# Patient Record
Sex: Female | Born: 1960 | Race: Black or African American | Hispanic: No | Marital: Married | Smoking: Never smoker
Health system: Southern US, Community
[De-identification: ages and names within clinical notes are randomized; demographics above are authoritative.]

## PROBLEM LIST (undated history)

## (undated) DIAGNOSIS — R200 Anesthesia of skin: Secondary | ICD-10-CM

## (undated) DIAGNOSIS — E079 Disorder of thyroid, unspecified: Secondary | ICD-10-CM

## (undated) DIAGNOSIS — T7840XA Allergy, unspecified, initial encounter: Secondary | ICD-10-CM

## (undated) DIAGNOSIS — I10 Essential (primary) hypertension: Secondary | ICD-10-CM

## (undated) DIAGNOSIS — M542 Cervicalgia: Secondary | ICD-10-CM

## (undated) DIAGNOSIS — R202 Paresthesia of skin: Secondary | ICD-10-CM

## (undated) HISTORY — DX: Allergy, unspecified, initial encounter: T78.40XA

## (undated) HISTORY — DX: Anesthesia of skin: R20.0

## (undated) HISTORY — DX: Essential (primary) hypertension: I10

## (undated) HISTORY — DX: Disorder of thyroid, unspecified: E07.9

## (undated) HISTORY — DX: Cervicalgia: M54.2

## (undated) HISTORY — DX: Paresthesia of skin: R20.2

---

## 2002-08-22 ENCOUNTER — Encounter: Admission: RE | Admit: 2002-08-22 | Discharge: 2002-08-22 | Payer: Self-pay | Admitting: Internal Medicine

## 2002-08-22 ENCOUNTER — Encounter: Payer: Self-pay | Admitting: Internal Medicine

## 2007-10-25 ENCOUNTER — Encounter: Admission: RE | Admit: 2007-10-25 | Discharge: 2007-10-25 | Payer: Self-pay | Admitting: Internal Medicine

## 2012-02-23 ENCOUNTER — Telehealth: Payer: Self-pay

## 2012-02-23 NOTE — Telephone Encounter (Signed)
.  umfc Victoria Petersen from [facility] Department called to request chest xray done in October of 2012.  Patient had +ppd in 2005 and is coming in for appt and stated that she had chest xray done at [facility] in 2012. Fax number is [phone].  Telephone for [name] is [phone].

## 2012-02-23 NOTE — Telephone Encounter (Signed)
Printed out phone message due to the patient's CXR is in the paper chart.

## 2012-10-24 ENCOUNTER — Ambulatory Visit (INDEPENDENT_AMBULATORY_CARE_PROVIDER_SITE_OTHER): Payer: 59 | Admitting: Emergency Medicine

## 2012-10-24 VITALS — BP 128/82 | HR 86 | Temp 98.6°F | Resp 17 | Ht 65.0 in | Wt 282.0 lb

## 2012-10-24 DIAGNOSIS — Z23 Encounter for immunization: Secondary | ICD-10-CM

## 2012-10-24 DIAGNOSIS — Z9229 Personal history of other drug therapy: Secondary | ICD-10-CM

## 2012-10-24 NOTE — Progress Notes (Signed)
  Subjective:    Patient ID: Victoria Petersen, female    DOB: [birth year], 52 y.o.   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 She is to get the summer and now needs a booster tetanus shot    Review of Systems     Objective:   Physical Exam        Assessment & Plan:  She will be given a DT booster today return in 6 months for her third shot

## 2013-07-03 ENCOUNTER — Ambulatory Visit (INDEPENDENT_AMBULATORY_CARE_PROVIDER_SITE_OTHER): Payer: 59 | Admitting: Family Medicine

## 2013-07-03 VITALS — BP 134/88 | HR 82 | Temp 98.5°F | Resp 18 | Ht 66.0 in | Wt 289.6 lb

## 2013-07-03 DIAGNOSIS — M549 Dorsalgia, unspecified: Secondary | ICD-10-CM

## 2013-07-03 DIAGNOSIS — R079 Chest pain, unspecified: Secondary | ICD-10-CM

## 2013-07-03 MED ORDER — GUAIFENESIN ER 1200 MG PO TB12: 1.0000 | ORAL_TABLET | Freq: Two times a day (BID) | ORAL | Status: DC | PRN

## 2013-07-03 MED ORDER — CYCLOBENZAPRINE HCL 10 MG PO TABS: 10.0000 mg | ORAL_TABLET | Freq: Three times a day (TID) | ORAL | Status: DC | PRN

## 2013-07-03 MED ORDER — MELOXICAM 15 MG PO TABS: 15.0000 mg | ORAL_TABLET | Freq: Every day | ORAL | Status: DC

## 2013-07-03 NOTE — Patient Instructions (Addendum)
Begin taking the Mobic (meloxicam) once daily for pain/inflammation.  Do not take any additional ibuprofen, Advil, Aleve with this medicine - you can take Tylenol if you need additional pain relief.  Taking the Flexeril (cyclobenzaprine) every 8 hours as need for pain/spasm - this may make you sleepy, so try at bedtime first.  Ok to take just at bedtime if it makes you too sleepy  Begin the Mucinex (guaifenesin) twice daily to help clear congestion.  You will be getting a call about scheduling with cardiology to discuss your EKG  Please let us know if any symptoms are worsening or not improving  If your chest pain becomes worse, or if you are feeling short of breath, come back in or go to the emergency department right away

## 2013-07-03 NOTE — Progress Notes (Signed)
Subjective:    Patient ID: Victoria Petersen, female    DOB: September 22, 1961, 52 y.o.   MRN: [id number]  HPI   [name] is a pleasant 52 yr old female here with concern for chest and upper back pain x 2 wks.  Thinks perhaps she has a cold with congestion in her chest, also thinks maybe pulled muscles.  States discomfort is across chest, primarily right side and between shoulder blades.  Denies coughing but may have had some wheezing 4 days ago.  No SOB.  No pain with deep inspiration.  No fever or other URI symptoms.  No abd pain.  Does do a lot of bending and stooping at work but cannot recall a specific injury to either area.  States she is not resting well because she can't get comfortable.  Movement of neck hurts.  CP does not worsen with exertion.  Nothing makes pain better or worse.    No prior cardiac hx though she is hypertensive.  Hyzaar is new within the last month.  Has been using ibuprofen which took the edge off.  States Advil did not work at all (?)  She had an episode similar to this 1 yr ago - states was treated for congestion and a pinched nerve.     Review of Systems  Constitutional: Negative for fever and chills.  HENT: Negative.   Respiratory: Positive for wheezing. Negative for cough and shortness of breath.   Cardiovascular: Positive for chest pain.  Gastrointestinal: Negative.   Musculoskeletal: Positive for back pain.  Skin: Negative.   Neurological: Negative for dizziness, syncope, weakness, light-headedness and headaches.       Objective:   Physical Exam  Vitals reviewed. Constitutional: She is oriented to person, place, and time. She appears well-developed and well-nourished. No distress.  HENT:  Head: Normocephalic and atraumatic.  Eyes: Conjunctivae are normal. No scleral icterus.  Cardiovascular: Normal rate, normal heart sounds and intact distal pulses.  Exam reveals no gallop and no friction rub.   No murmur heard. Pulmonary/Chest: Effort normal and breath  sounds normal. She has no wheezes. She has no rales. She exhibits tenderness.    Musculoskeletal:       Cervical back: She exhibits tenderness, pain and spasm. She exhibits normal range of motion.       Back:  Neurological: She is alert and oriented to person, place, and time.  Skin: Skin is warm and dry.  Psychiatric: She has a normal mood and affect.     EKG interp with Dr. Neva Seat - nonspecific T wave changes in III avF, no previous ekg to compare    Assessment & Plan:  Upper back pain - Plan: EKG 12-Lead, cyclobenzaprine (FLEXERIL) 10 MG tablet, meloxicam (MOBIC) 15 MG tablet  Chest pain - Plan: EKG 12-Lead, meloxicam (MOBIC) 15 MG tablet, Ambulatory referral to Cardiology   [name] is a 52 yr old female here with 2 wks of upper back and right sided chest pain.  NKI.  On exam, back pain is reproducible on palpation of paraspinals, esp left side.  Palpable spasm.  CP is not completely reproducible but it is right sided only with no radiation and no exacerbation with exertion.  Suspect MSK etiology of pain.  EKG today is borderline though with some non-specific T wave changes.  Given clinical picture, much less concerned for cardiac etiology, but will refer to cards for further eval, risk stratification given pt's age, HTN, and obesity.  Will treat with mobic and  flexeril.  Will try mucinex for congestion.  RTC if worsening or not improving.    Case discussed with Dr. Neva Seat

## 2013-07-05 NOTE — Progress Notes (Signed)
EKG read and patient discussed with Frances Furbish, PA-C. Agree with assessment and plan of care per her note.

## 2013-07-30 ENCOUNTER — Ambulatory Visit: Payer: Self-pay | Admitting: Cardiovascular Disease

## 2013-08-19 ENCOUNTER — Encounter: Payer: Self-pay | Admitting: Cardiovascular Disease

## 2013-08-19 DIAGNOSIS — T7840XA Allergy, unspecified, initial encounter: Secondary | ICD-10-CM | POA: Insufficient documentation

## 2013-08-19 DIAGNOSIS — I10 Essential (primary) hypertension: Secondary | ICD-10-CM | POA: Insufficient documentation

## 2013-08-19 DIAGNOSIS — E079 Disorder of thyroid, unspecified: Secondary | ICD-10-CM | POA: Insufficient documentation

## 2013-08-20 ENCOUNTER — Ambulatory Visit (INDEPENDENT_AMBULATORY_CARE_PROVIDER_SITE_OTHER): Payer: 59 | Admitting: Cardiovascular Disease

## 2013-08-20 ENCOUNTER — Encounter: Payer: Self-pay | Admitting: Cardiovascular Disease

## 2013-08-20 ENCOUNTER — Encounter (INDEPENDENT_AMBULATORY_CARE_PROVIDER_SITE_OTHER): Payer: Self-pay

## 2013-08-20 VITALS — BP 128/72 | HR 74 | Ht 65.0 in | Wt 290.8 lb

## 2013-08-20 DIAGNOSIS — R079 Chest pain, unspecified: Secondary | ICD-10-CM

## 2013-08-20 DIAGNOSIS — I10 Essential (primary) hypertension: Secondary | ICD-10-CM

## 2013-08-20 DIAGNOSIS — E669 Obesity, unspecified: Secondary | ICD-10-CM

## 2013-08-20 NOTE — Assessment & Plan Note (Signed)
Well controlled.  Continue current medications and low sodium Dash type diet.   F/U with primary to adjust meds

## 2013-08-20 NOTE — Assessment & Plan Note (Signed)
Discussed low carb diet  Refer to nutritionist

## 2013-08-20 NOTE — Assessment & Plan Note (Signed)
Atypical with ECG changes f/u stress myovue

## 2013-08-20 NOTE — Patient Instructions (Signed)
Your physician recommends that you schedule a follow-up appointment in: AS NEEDED  Your physician recommends that you continue on your current medications as directed. Please refer to the Current Medication list given to you today. Your physician has requested that you have en exercise stress myoview. For further information please visit [email or web address]. Please follow instruction sheet, as given.  

## 2013-08-20 NOTE — Progress Notes (Signed)
Patient ID: Victoria Petersen, female   DOB: 1961-05-04, 52 y.o.   MRN: [id number]      52 yo with HTN.  Father has a pacer. Seen at urgent care for atypical chest pain. Sharp pain in right side of chest and between shoulder blades.  No history of CAD.  Pain is a bit positional.  ECG with minor inferior T wave changes. She is sedentary with fairly high carb diet and sedentary. 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 Dr Elita Quick has been changing her BP meds and she is not clear that her current med is not contributing to problem  ROS: Denies fever, malais, weight loss, blurry vision, decreased visual acuity, cough, sputum, SOB, hemoptysis, pleuritic pain, palpitaitons, heartburn, abdominal pain, melena, lower extremity edema, claudication, or rash.  All other systems reviewed and negative   General: Affect appropriate Obese black female HEENT: normal Neck supple with no adenopathy JVP normal no bruits no thyromegaly Lungs clear with no wheezing and good diaphragmatic motion Heart:  S1/S2 no murmur,rub, gallop or click PMI normal Abdomen: benighn, BS positve, no tenderness, no AAA no bruit.  No HSM or HJR Distal pulses intact with no bruits No edema Neuro non-focal Skin warm and dry No muscular weakness  Medications Current Outpatient Prescriptions  Medication Sig Dispense Refill  . amLODipine (NORVASC) 5 MG tablet Take 5 mg by mouth daily.      Marland Kitchen ibuprofen (ADVIL,MOTRIN) 400 MG tablet Take 400 mg by mouth every 6 (six) hours as needed.      Marland Kitchen losartan-hydrochlorothiazide (HYZAAR) 50-12.5 MG per tablet Take 1 tablet by mouth daily.      Marland Kitchen MIMVEY 1-0.5 MG per tablet Take 1 tablet by mouth daily.       No current facility-administered medications for this visit.    Allergies Percocet  Family History: Family History  Problem Relation Age of Onset  .  Hypertension Mother   . Hypertension Father   . Hypertension Sister     Social History: History   Social History  . Marital Status: Married    Spouse Name: N/A    Number of Children: N/A  . Years of Education: N/A   Occupational History  . Not on file.   Social History Main Topics  . Smoking status: Never Smoker   . Smokeless tobacco: Not on file  . Alcohol Use: No  . Drug Use: No  . Sexual Activity: No   Other Topics Concern  . Not on file   Social History Narrative  . No narrative on file    Electrocardiogram:  9/18 SR inferior T wave changes   Assessment and Plan

## 2013-09-02 ENCOUNTER — Encounter: Payer: Self-pay | Admitting: Cardiology

## 2013-09-02 ENCOUNTER — Ambulatory Visit (HOSPITAL_COMMUNITY): Payer: 59 | Attending: Cardiology | Admitting: Radiology

## 2013-09-02 VITALS — BP 139/87 | HR 79 | Ht 65.0 in | Wt 289.0 lb

## 2013-09-02 DIAGNOSIS — I10 Essential (primary) hypertension: Secondary | ICD-10-CM | POA: Insufficient documentation

## 2013-09-02 DIAGNOSIS — R5381 Other malaise: Secondary | ICD-10-CM | POA: Insufficient documentation

## 2013-09-02 DIAGNOSIS — R9431 Abnormal electrocardiogram [ECG] [EKG]: Secondary | ICD-10-CM | POA: Insufficient documentation

## 2013-09-02 DIAGNOSIS — M549 Dorsalgia, unspecified: Secondary | ICD-10-CM | POA: Insufficient documentation

## 2013-09-02 DIAGNOSIS — R079 Chest pain, unspecified: Secondary | ICD-10-CM

## 2013-09-02 MED ORDER — TECHNETIUM TC 99M SESTAMIBI GENERIC - CARDIOLITE
30.0000 | Freq: Once | INTRAVENOUS | Status: AC | PRN
  Administered 2013-09-02: 30 via INTRAVENOUS

## 2013-09-02 NOTE — Progress Notes (Signed)
  [facility] SITE 3 NUCLEAR MED [address] [place], [place] [address] [phone]    Cardiology Nuclear Med Study  Victoria Petersen is a 52 y.o. female     MRN : [id number]     DOB: [birth year]  Procedure Date: 09/02/2013  Nuclear Med Background Indication for Stress Test:  Evaluation for Ischemia and Abnormal EKG:T wave changes History:  No known cardiac history Cardiac Risk Factors: Hypertension  Symptoms:  Fatigue with Exertion and Pain in her back at shoulder blades   Nuclear Pre-Procedure Caffeine/Decaff Intake:  None NPO After: 8:30pm   Lungs:  clear O2 Sat: 93% on room air. IV 0.9% NS with Angio Cath:  22g  IV Site: R Hand  IV Started by:  Cathlyn Parsons, RN  Chest Size (in):  42 Cup Size: C  Height: 5\' 5"  (1.651 m)  Weight:  289 lb (131.09 kg)  BMI:  Body mass index is 48.09 kg/(m^2). Tech Comments:  n/a    Nuclear Med Study 1 or 2 day study: 2 day  Stress Test Type:  Stress  Reading MD: Olga Millers, MD  Order Authorizing Provider:  Burna Cash  Resting Radionuclide: Technetium 22m Sestamibi  Resting Radionuclide Dose: 33.0 mCi on 09-02-13  Stress Radionuclide:  Technetium 54m Sestamibi  Stress Radionuclide Dose: 33.0 mCi on 09-04-13          Stress Protocol Rest HR: 79 Stress HR: 146  Rest BP: 139/87 Stress BP: 215/110  Exercise Time (min): 6:00 METS: 7.0   Predicted Max HR: 167 bpm % Max HR: 87.43 bpm Rate Pressure Product: 02725   Dose of Adenosine (mg):  n/a Dose of Lexiscan: n/a mg  Dose of Atropine (mg): n/a Dose of Dobutamine: n/a mcg/kg/min (at max HR)  Stress Test Technologist: Nelson Chimes, BS-ES  Nuclear Technologist:  Doyne Keel, CNMT     Rest Procedure:  Myocardial perfusion imaging was performed at rest 45 minutes following the intravenous administration of Technetium 19m Sestamibi. Rest ECG: NSR - Normal EKG  Stress Procedure:  The patient exercised on the treadmill utilizing the Bruce Protocol for 6:00 minutes.  The patient stopped due to SOB, fatigue and denied any chest pain.  Technetium 60m Sestamibi was injected at peak exercise and myocardial perfusion imaging was performed after a brief delay. Recovery uneventful.  Stress ECG: No significant change from baseline ECG  QPS Raw Data Images:  Normal; no motion artifact; normal heart/lung ratio. Stress Images:  Normal homogeneous uptake in all areas of the myocardium. Rest Images:  Normal homogeneous uptake in all areas of the myocardium. Subtraction (SDS):  Normal Transient Ischemic Dilatation (Normal <1.22):  1.12 Lung/Heart Ratio (Normal <0.45):  0.27  Quantitative Gated Spect Images QGS EDV:  97 ml QGS ESV:  32 ml  Impression Exercise Capacity:  Good exercise capacity. BP Response:  Hypertensive blood pressure response. Clinical Symptoms:  No significant symptoms noted. ECG Impression:  No significant ST segment change suggestive of ischemia. Comparison with Prior Nuclear Study: No previous nuclear study performed  Overall Impression:  Normal stress nuclear study. Mild breast attenuation  LV Ejection Fraction: 67%.  LV Wall Motion:  NL LV Function; NL Wall Motion  Charlton Haws

## 2013-09-04 ENCOUNTER — Encounter: Payer: Self-pay | Admitting: Cardiovascular Disease

## 2013-09-04 ENCOUNTER — Encounter (INDEPENDENT_AMBULATORY_CARE_PROVIDER_SITE_OTHER): Payer: Self-pay

## 2013-09-04 ENCOUNTER — Ambulatory Visit (HOSPITAL_COMMUNITY): Payer: 59 | Attending: Cardiovascular Disease | Admitting: Radiology

## 2013-09-04 DIAGNOSIS — R0989 Other specified symptoms and signs involving the circulatory and respiratory systems: Secondary | ICD-10-CM

## 2013-09-04 MED ORDER — TECHNETIUM TC 99M SESTAMIBI GENERIC - CARDIOLITE
33.0000 | Freq: Once | INTRAVENOUS | Status: AC | PRN
  Administered 2013-09-04: 33 via INTRAVENOUS

## 2013-09-19 ENCOUNTER — Ambulatory Visit (INDEPENDENT_AMBULATORY_CARE_PROVIDER_SITE_OTHER): Payer: 59 | Admitting: Family Medicine

## 2013-09-19 ENCOUNTER — Ambulatory Visit: Payer: 59

## 2013-09-19 VITALS — BP 122/70 | HR 76 | Temp 98.7°F | Resp 18 | Ht 64.5 in | Wt 288.0 lb

## 2013-09-19 DIAGNOSIS — Z111 Encounter for screening for respiratory tuberculosis: Secondary | ICD-10-CM

## 2013-09-19 DIAGNOSIS — R7611 Nonspecific reaction to tuberculin skin test without active tuberculosis: Secondary | ICD-10-CM

## 2013-09-19 NOTE — Progress Notes (Addendum)
Subjective:    Patient ID: Victoria Petersen, female    DOB: 1961-02-17, 52 y.o.   MRN: [id number] This chart was scribed for Victoria Staggers, MD by Danella Maiers, ED Scribe. This patient was seen in room 11 and the patient's care was started at 9:10 AM.  Authored by Silas Sacramento, MD - unable to change in system.   Chief Complaint  Patient presents with   x ray    has has positive in the past     HPI HPI Comments: TYEISHA Petersen is a 52 y.o. female who presents to the [facility] for a CXR. I saw her with Frances Furbish 9/18 with chest and upper back pain for the past 2 weeks, suspected MSK etiology at that time but nonspecific t-wave changes on an EKG. Referred to cardiology and treated with mobic and flexeril. Seen by cardiology 11/5. Diagnosed with atypical CP. Had stress test on 11/18 that was normal. Normal stress nuclear study. Sx's improved, but if recur, plans on seeing gastroenterologist. asx currently.   Pt states she is required by her work to have a CXR to look for TB. She states it has been 20 years since she has had a positive tb reading. She has never had any treatment for TB. She denies any symptoms currently. She states her last CXR was one year ago and negative. She denies fevers, chills.   Nursing Asst 2 - with [employer]    Tuberculosis Symptom Questionnaire  Do you currently have any of the following symptoms?  1. No Unexplained cough lasting more than 3 weeks?   2. No Unexplained fever lasting more than 3 weeks.   3. Yes  - having menopausal symptoms. Night Sweats (sweating that leaves the bedclothes and sheets wet)    4. No Shortness of Breath   5. No Chest Pain   6. No Unintentional weight loss    7. No.  Unexplained fatigue (very tired for no reason)    Patient Active Problem List   Diagnosis Date Noted   Chest pain 08/20/2013   Morbid obesity 08/20/2013   Allergy    Hypertension    Thyroid disease     Past Medical History  Diagnosis Date   Allergy    Hypertension    Thyroid disease    Past Surgical History  Procedure Laterality Date   Cesarean section     Allergies  Allergen Reactions   Percocet [Oxycodone-Acetaminophen] Itching   Prior to Admission medications   Medication Sig Start Date End Date Taking? Authorizing Provider  amLODipine (NORVASC) 5 MG tablet Take 5 mg by mouth daily.   Yes Historical Provider, MD  ibuprofen (ADVIL,MOTRIN) 400 MG tablet Take 400 mg by mouth every 6 (six) hours as needed.   Yes Historical Provider, MD  losartan-hydrochlorothiazide (HYZAAR) 50-12.5 MG per tablet Take 1 tablet by mouth daily.   Yes Historical Provider, MD  MIMVEY 1-0.5 MG per tablet Take 1 tablet by mouth daily. 07/08/13  Yes Historical Provider, MD   History  Substance Use Topics   Smoking status: Never Smoker    Smokeless tobacco: Not on file   Alcohol Use: No     Review of Systems As above.     Objective:   Physical Exam  Vitals reviewed. Constitutional: She is oriented to person, place, and time. She appears well-developed and well-nourished. No distress.  Cardiovascular: Normal rate, regular rhythm, normal heart sounds and intact distal pulses.  Pulmonary/Chest: Effort normal and breath sounds normal.  Neurological: She is alert and oriented to person, place, and time.  Skin: Skin is warm and dry. No rash noted.  Psychiatric: She has a normal mood and affect. Her behavior is normal.    Filed Vitals:   09/19/13 0841  BP: 122/70  Pulse: 76  Temp: 98.7 F (37.1 C)  TempSrc: Oral  Resp: 18  Height: 5' 4.5" (1.638 m)  Weight: 288 lb (130.636 kg)  SpO2: 96%    [facility] reading (PRIMARY) by  Dr. Neva Seat: 1 view CXR: NAD      Assessment & Plan:   ADALEI Petersen is a 52 y.o. 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 No acute symptoms as above, and XR appears ok. Note for work given, report pending.    Prior CP resolved. rtc precautions.   No orders of the defined types were placed in this encounter.   Patient Instructions  Your xray report can be sent to your employer if needed, but see the printed letter. Let me know if there are any questions.       I personally performed the services described in this documentation, which was scribed in my presence. The recorded information has been reviewed and considered, and addended by me as needed.

## 2013-09-19 NOTE — Progress Notes (Signed)
TB SCREENING in patient with history of POSITIVE reaction to TB Skin Test  Has the patient experienced any of the following in the last 12 months? Unexplained productive cough no Unexplained weight loss no Unexplained appetite loss no Unexplained fever no  Night sweats no Shortness of breath no   Chest pain no Increased fatigue no Exposure to Tuberculosis no Development of Diabetes no 

## 2013-09-19 NOTE — Patient Instructions (Signed)
Your xray report can be sent to your employer if needed, but see the printed letter. Let me know if there are any questions.

## 2013-11-14 ENCOUNTER — Ambulatory Visit (INDEPENDENT_AMBULATORY_CARE_PROVIDER_SITE_OTHER): Payer: 59 | Admitting: Neurology

## 2013-11-14 ENCOUNTER — Encounter: Payer: Self-pay | Admitting: Neurology

## 2013-11-14 ENCOUNTER — Encounter (INDEPENDENT_AMBULATORY_CARE_PROVIDER_SITE_OTHER): Payer: Self-pay

## 2013-11-14 VITALS — BP 138/93 | HR 86 | Ht 64.0 in | Wt 292.0 lb

## 2013-11-14 DIAGNOSIS — R209 Unspecified disturbances of skin sensation: Secondary | ICD-10-CM

## 2013-11-14 DIAGNOSIS — R202 Paresthesia of skin: Secondary | ICD-10-CM

## 2013-11-14 DIAGNOSIS — M79609 Pain in unspecified limb: Secondary | ICD-10-CM

## 2013-11-14 MED ORDER — DULOXETINE HCL 60 MG PO CPEP
60.0000 mg | ORAL_CAPSULE | Freq: Every day | ORAL | Status: DC
Start: 2013-11-14 — End: 2013-12-15

## 2013-11-14 NOTE — Progress Notes (Signed)
PATIENT: Victoria Petersen DOB: 07-09-1961  HISTORICAL  Victoria Petersen  is a 53 years old right-handed African American female, referred by her PCP Dr. Ludwig Clarks for evaluation of left-sided pain  She had past medical history of hypertension, presenting with his subacute onset of left-sided pain, involving left shoulder, left arm, and also left leg, she describes shooting pain from left lower back to her left hip, along her left leg, she denies significant weakness, left shoulder pain is almost constant, There was  no limited range of motion,  She described worsening left leg pain with standing, or prolonged sitting.  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 Angela Nevin, she had regular MRI of the brain, but I did not see any of the record    REVIEW OF SYSTEMS: Full 14 system review of systems performed and notable only for irritable bowel syndrome   ALLERGIES: Allergies  Allergen Reactions  . Percocet [Oxycodone-Acetaminophen] Itching    HOME MEDICATIONS: Outpatient Prescriptions Prior to Visit  Medication Sig Dispense Refill  . amLODipine (NORVASC) 5 MG tablet Take 5 mg by mouth daily.      Marland Kitchen losartan-hydrochlorothiazide (HYZAAR) 50-12.5 MG per tablet Take 1 tablet by mouth daily.      Marland Kitchen MIMVEY 1-0.5 MG per tablet Take 1 tablet by mouth daily.      Marland Kitchen ibuprofen (ADVIL,MOTRIN) 400 MG tablet Take 400 mg by mouth every 6 (six) hours as needed.         PAST MEDICAL HISTORY: Past Medical History  Diagnosis Date  . Allergy   . Hypertension   . Thyroid disease   . Neck pain   . Numbness   . Tingling     PAST SURGICAL HISTORY: Past Surgical History  Procedure Laterality Date  . Cesarean section      x2    FAMILY HISTORY: Family History  Problem Relation Age of Onset  . Hypertension Mother   . Hypertension Father   .  Hypertension Sister     SOCIAL HISTORY:  History   Social History  . Marital Status: Married    Spouse Name: Victoria Petersen    Number of Children: 2  . Years of Education: college   Occupational History  . [employer]   Social History Main Topics  . Smoking status: Never Smoker   . Smokeless tobacco: Never Used  . Alcohol Use: No  . Drug Use: No  . Sexual Activity: No   Other Topics Concern  . Not on file   Social History Narrative   Patient lives at home with her husband Victoria Petersen).   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   Right handed.   Education- B.S Human services   Caffeine- None     PHYSICAL EXAM   Filed Vitals:   11/14/13 1453  BP: 138/93  Pulse: 86  Height: 5\' 4"  (1.626 m)  Weight: 292 lb (132.45 kg)    Not recorded    Body mass index is 50.1 kg/(m^2).   Generalized: In no acute distress  Neck: Supple, no carotid bruits   Cardiac: Regular rate rhythm  Pulmonary: Clear to auscultation bilaterally  Musculoskeletal: No deformity mild tenderness of left shoulder upon deep palpation, there was no limited range of motion,  Neurological examination  Mentation: Alert oriented to time, place, history taking, and  causual conversation  Cranial nerve II-XII: Pupils were equal round reactive to light extraocular movements were full, Visual field were full on confrontational test. Bilateral fundi were sharp.  Facial sensation and strength were normal. Hearing was intact to finger rubbing bilaterally. Uvula tongue midline.  head turning and shoulder shrug and were normal and symmetric.Tongue protrusion into cheek strength was normal.  Motor: normal tone, bulk and strength.  Sensory: Intact to fine touch, pinprick, preserved vibratory sensation, and proprioception at toes.  Coordination: Normal finger to nose, heel-to-shin bilaterally there was no truncal ataxia  Gait: Rising up from seated position without assistance, normal  stance, without trunk ataxia, moderate stride, good arm swing, smooth turning, able to perform tiptoe, and heel walking without difficulty.   Romberg signs: Negative  Deep tendon reflexes: Brachioradialis 2/2, biceps 2/2, triceps 2/2, patellar 2/2, Achilles 2/2, plantar responses were flexor bilaterally.   DIAGNOSTIC DATA (LABS, IMAGING, TESTING) - I reviewed patient records, labs, notes, testing and imaging myself where available.   ASSESSMENT AND PLAN   53 years old Caucasian female, presenting with acute onset of left side pain, essentially normal neurological examination, reported a history of pituitary tumor,  1.  differentiation diagnosis including musculoskeletal etiology, need to rule out a right thalamic stroke 2 Proceed with MRI of the brain with and without contrast 3. EMG nerve conduction study  4 Cymbalta 60 mg every day           Levert FeinsteinYijun Antaeus Karel, M.D. Ph.D.  [facility] [address], [address] [place], [place] [address] [phone]

## 2013-11-17 ENCOUNTER — Telehealth: Payer: Self-pay | Admitting: Neurology

## 2013-11-17 NOTE — Telephone Encounter (Signed)
[facility] called stating that patient needs lab work done within 6 week before MRI is done. Victoria Petersen can be reached at [phone]. Please advise.

## 2013-11-17 NOTE — Telephone Encounter (Signed)
E[facility]Carolyn [facility] calling to state that Dr. Terrace ArabiaYan ordered an MRI for patient but patient has no recent labwork. Patient had hypertension and Eber JonesCarolyn needs some labwork done within 6 weeks before the MRI can be done. Please call her back and advise.

## 2013-11-18 ENCOUNTER — Telehealth: Payer: Self-pay | Admitting: *Deleted

## 2013-11-19 NOTE — Telephone Encounter (Addendum)
Victoria Petersen,   Please call patient to come in for CMP before MRI, if she had a CMP with in the past 6 weeks, she does not have to have repeat laboratory evaluation, but she does need to bring her laboratories at the day of the MRI  I will address her medication questions two-month followup visit

## 2013-11-20 ENCOUNTER — Ambulatory Visit (INDEPENDENT_AMBULATORY_CARE_PROVIDER_SITE_OTHER): Payer: 59 | Admitting: Neurology

## 2013-11-20 ENCOUNTER — Ambulatory Visit (INDEPENDENT_AMBULATORY_CARE_PROVIDER_SITE_OTHER): Payer: 59

## 2013-11-20 ENCOUNTER — Encounter (INDEPENDENT_AMBULATORY_CARE_PROVIDER_SITE_OTHER): Payer: Self-pay

## 2013-11-20 DIAGNOSIS — R202 Paresthesia of skin: Secondary | ICD-10-CM

## 2013-11-20 DIAGNOSIS — Z0289 Encounter for other administrative examinations: Secondary | ICD-10-CM

## 2013-11-20 DIAGNOSIS — R209 Unspecified disturbances of skin sensation: Secondary | ICD-10-CM

## 2013-11-20 DIAGNOSIS — M79609 Pain in unspecified limb: Secondary | ICD-10-CM

## 2013-11-20 MED ORDER — GADOPENTETATE DIMEGLUMINE 469.01 MG/ML IV SOLN: 20.0000 mL | Freq: Once | INTRAVENOUS | Status: AC | PRN

## 2013-11-20 NOTE — Procedures (Signed)
   NCS (NERVE CONDUCTION STUDY) WITH EMG (ELECTROMYOGRAPHY) REPORT   STUDY DATE: February fifth 2015 PATIENT NAME: Victoria ChuteSharon M President DOB: 1961/09/13 MRN: [id number]    TECHNOLOGIST: Gearldine ShownLorraine Jones ELECTROMYOGRAPHER: Levert FeinsteinYan, Bao Bazen M.D.  CLINICAL INFORMATION:   53 years old obese female, presenting with left arm, left leg radiating pain, left toes numbness  On examination: Bilateral upper and lower extremity motor strength was normal, deep tendon reflex was normal and symmetric    FINDINGS: NERVE CONDUCTION STUDY: Left peroneal sensory response was normal. Left peroneal to EDB, tibial motor responses were normal. Left tibial H reflex was normal and present.  Left median, ulnar sensory and motor responses were normal  NEEDLE ELECTROMYOGRAPHY: Selected needle examination was performed at left lower extremity muscle, at the left lumbosacral paraspinal muscles  Needle examination of left tibialis anterior, tibialis posterior, medial gastrocnemius, vastus lateralis, biceps femoris long head, gluteus medius was normal. There was increased insertion activity, 1 plus positive waves at left L4. there was no spontaneous activity at left L5, S1.  IMPRESSION:   This is a normal study. There was no electrodiagnostic study evidence of large fiber peripheral neuropathy, or left lumbar radiculopathy   INTERPRETING PHYSICIAN:   Levert FeinsteinYan, Peighton Mehra M.D. Ph.D. [facility] [address], [address] [place], [place] [address] [phone]

## 2013-11-21 MED ORDER — GABAPENTIN 300 MG PO CAPS: 300.0000 mg | ORAL_CAPSULE | Freq: Three times a day (TID) | ORAL | Status: DC

## 2013-11-21 NOTE — Telephone Encounter (Signed)
Patient had her MRI and had labs from her PCP that she brought with her.  She wants the doctor to prescribe something for pain.  She doesn't want to have to wait until her follow up visit.

## 2013-11-21 NOTE — Telephone Encounter (Signed)
Jessica: Please call patient I have called in Neurontin 300 mg 3 times a day for her,

## 2013-11-21 NOTE — Telephone Encounter (Signed)
I called the patient.  Got no answer.  Left message.   

## 2013-12-15 ENCOUNTER — Ambulatory Visit (INDEPENDENT_AMBULATORY_CARE_PROVIDER_SITE_OTHER): Payer: 59 | Admitting: Neurology

## 2013-12-15 ENCOUNTER — Encounter (INDEPENDENT_AMBULATORY_CARE_PROVIDER_SITE_OTHER): Payer: Self-pay

## 2013-12-15 ENCOUNTER — Encounter: Payer: Self-pay | Admitting: Neurology

## 2013-12-15 VITALS — BP 133/88 | HR 78 | Ht 64.0 in | Wt 293.0 lb

## 2013-12-15 DIAGNOSIS — M25512 Pain in left shoulder: Secondary | ICD-10-CM | POA: Insufficient documentation

## 2013-12-15 DIAGNOSIS — M25519 Pain in unspecified shoulder: Secondary | ICD-10-CM

## 2013-12-15 MED ORDER — CELECOXIB 100 MG PO CAPS
100.0000 mg | ORAL_CAPSULE | Freq: Two times a day (BID) | ORAL | Status: DC
Start: 2013-12-15 — End: 2022-01-03

## 2013-12-15 MED ORDER — CELECOXIB 100 MG PO CAPS: 100.0000 mg | ORAL_CAPSULE | Freq: Two times a day (BID) | ORAL | Status: DC

## 2013-12-15 NOTE — Progress Notes (Signed)
PATIENT: Victoria Petersen DOB: 03/14/1961  HISTORICAL  SERETHA ESTABROOKS  is a 53 years old right-handed African American female, referred by her PCP Dr. Ludwig Clarks for evaluation of left-sided pain  She had past medical history of hypertension, presenting with subacute onset of left-sided pain since December 2014, involving left shoulder, left arm, and also left leg, she describes shooting pain from left lower back to her left hip, along her left leg, she denies significant weakness, left shoulder pain is almost constant, There was  no limited range of motion,  She described worsening left leg pain with standing, or prolonged sitting.  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 Angela Nevin, she had regular MRI of the brain, but I did not see any of the record    Update March second 2015: MRI of the brain was normal, electrodiagnostic study was normal, there was no evidence of left cervical, lumbar radiculopathy, she continued complains of intermittent left shoulder pain, left lower back pain, radiating pain to her left leg, mild gait difficulty due to pain, bilateral toes numbness, getting worse by prolonged standing, sitting, heavy lifting, she is working on [employer] duty, which has helped   REVIEW OF SYSTEMS: Full 14 system review of systems performed and notable only for low back pain, left shoulder pain   ALLERGIES: Allergies  Allergen Reactions  . Percocet [Oxycodone-Acetaminophen] Itching    HOME MEDICATIONS: Outpatient Prescriptions Prior to Visit  Medication Sig Dispense Refill  . amLODipine (NORVASC) 5 MG tablet Take 5 mg by mouth daily.      Marland Kitchen losartan-hydrochlorothiazide (HYZAAR) 50-12.5 MG per tablet Take 1 tablet by mouth daily.      Marland Kitchen MIMVEY 1-0.5 MG per tablet Take 1 tablet by mouth daily.      Marland Kitchen ibuprofen (ADVIL,MOTRIN) 400  MG tablet Take 400 mg by mouth every 6 (six) hours as needed.         PAST MEDICAL HISTORY: Past Medical History  Diagnosis Date  . Allergy   . Hypertension   . Thyroid disease   . Neck pain   . Numbness   . Tingling     PAST SURGICAL HISTORY: Past Surgical History  Procedure Laterality Date  . Cesarean section      x2    FAMILY HISTORY: Family History  Problem Relation Age of Onset  . Hypertension Mother   . Hypertension Father   . Hypertension Sister     SOCIAL HISTORY:  History   Social History  . Marital Status: Married    Spouse Name: Romeo Apple    Number of Children: 2  . Years of Education: college   Occupational History  . [employer]   Social History Main Topics  . Smoking status: Never Smoker   . Smokeless tobacco: Never Used  . Alcohol Use: No  . Drug Use: No  . Sexual Activity: No   Other Topics Concern  . Not on file   Social History Narrative   Patient lives at home with her husband Sharlet Salina).   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   Right handed.   Education- B.S Human services   Caffeine- None     PHYSICAL EXAM   Filed Vitals:   12/15/13 0842  BP: 133/88  Pulse: 78  Height: 5\' 4"  (1.626 m)  Weight: 293  lb (132.904 kg)    Not recorded    Body mass index is 50.27 kg/(m^2).   Generalized: In no acute distress  Neck: Supple, no carotid bruits   Cardiac: Regular rate rhythm  Pulmonary: Clear to auscultation bilaterally  Musculoskeletal: No deformity mild tenderness of left shoulder upon deep palpation, there was no limited range of motion,  Neurological examination  Mentation: Alert oriented to time, place, history taking, and causual conversation  Cranial nerve II-XII: Pupils were equal round reactive to light extraocular movements were full, Visual field were full on confrontational test. Bilateral fundi were sharp.  Facial sensation and strength were normal. Hearing was intact to finger  rubbing bilaterally. Uvula tongue midline.  head turning and shoulder shrug and were normal and symmetric.Tongue protrusion into cheek strength was normal.  Motor: normal tone, bulk and strength.  Tenderness upon deep palpation at left shoulder, left SI joints,   Sensory: Intact to fine touch, pinprick, preserved vibratory sensation, and proprioception at toes.  Coordination: Normal finger to nose, heel-to-shin bilaterally there was no truncal ataxia  Gait: Rising up from seated position without assistance, Mild atalgic gait, was able to perform tiptoe, heel walking,   Romberg signs: Negative  Deep tendon reflexes: Brachioradialis 2/2, biceps 2/2, triceps 2/2, patellar 2/2, Achilles 2/2, plantar responses were flexor bilaterally.   DIAGNOSTIC DATA (LABS, IMAGING, TESTING) - I reviewed patient records, labs, notes, testing and imaging myself where available.   ASSESSMENT AND PLAN   53 years old Caucasian female, presenting with acute onset of left side pain, essentially normal neurological examination, reported a history of pituitary tumor,Normal MRI of brain, electrodiagnostic study, there was no evidence of left cervical, left lumbosacral radiculopathies.   her pain is most consistent with musculoskeletal etiology, I will add on Celebrex 100 mg twice a day, refer her to orthopedic clinic, return to clinic as needed     Levert FeinsteinYijun Venson Ferencz, M.D. Ph.D.  [facility] [address], [address] [place], [place] [address] [phone]

## 2013-12-16 ENCOUNTER — Ambulatory Visit: Payer: Self-pay | Admitting: Neurology

## 2014-01-28 ENCOUNTER — Telehealth: Payer: Self-pay | Admitting: Neurology

## 2014-01-28 NOTE — Telephone Encounter (Signed)
I have reviewed her chart, extensive evaluation fail to demonstrate neurological conditions, with her complains of continued pain, frustration, [facility]

## 2017-07-30 NOTE — Telephone Encounter (Signed)
Close Encounter 

## 2021-12-30 ENCOUNTER — Other Ambulatory Visit: Payer: Self-pay

## 2021-12-30 ENCOUNTER — Encounter (HOSPITAL_BASED_OUTPATIENT_CLINIC_OR_DEPARTMENT_OTHER): Payer: Self-pay | Admitting: *Deleted

## 2021-12-30 ENCOUNTER — Emergency Department (HOSPITAL_BASED_OUTPATIENT_CLINIC_OR_DEPARTMENT_OTHER): Payer: Managed Care, Other (non HMO)

## 2021-12-30 ENCOUNTER — Inpatient Hospital Stay (HOSPITAL_BASED_OUTPATIENT_CLINIC_OR_DEPARTMENT_OTHER)
Admission: EM | Admit: 2021-12-30 | Discharge: 2022-01-03 | DRG: 176 | Disposition: A | Discharge status: Home / Self Care | Payer: Managed Care, Other (non HMO) | Attending: Cardiovascular Disease | Admitting: Cardiovascular Disease

## 2021-12-30 DIAGNOSIS — Z8249 Family history of ischemic heart disease and other diseases of the circulatory system: Secondary | ICD-10-CM

## 2021-12-30 DIAGNOSIS — Z79899 Other long term (current) drug therapy: Secondary | ICD-10-CM

## 2021-12-30 DIAGNOSIS — Z888 Allergy status to other drugs, medicaments and biological substances status: Secondary | ICD-10-CM

## 2021-12-30 DIAGNOSIS — I2694 Multiple subsegmental pulmonary emboli without acute cor pulmonale: Principal | ICD-10-CM | POA: Diagnosis present

## 2021-12-30 DIAGNOSIS — I214 Non-ST elevation (NSTEMI) myocardial infarction: Principal | ICD-10-CM | POA: Diagnosis present

## 2021-12-30 DIAGNOSIS — E039 Hypothyroidism, unspecified: Secondary | ICD-10-CM | POA: Diagnosis present

## 2021-12-30 DIAGNOSIS — I10 Essential (primary) hypertension: Secondary | ICD-10-CM | POA: Diagnosis present

## 2021-12-30 DIAGNOSIS — I82452 Acute embolism and thrombosis of left peroneal vein: Secondary | ICD-10-CM | POA: Diagnosis present

## 2021-12-30 DIAGNOSIS — Z20822 Contact with and (suspected) exposure to covid-19: Secondary | ICD-10-CM | POA: Diagnosis present

## 2021-12-30 DIAGNOSIS — E785 Hyperlipidemia, unspecified: Secondary | ICD-10-CM | POA: Diagnosis present

## 2021-12-30 DIAGNOSIS — E1165 Type 2 diabetes mellitus with hyperglycemia: Secondary | ICD-10-CM | POA: Diagnosis present

## 2021-12-30 DIAGNOSIS — I82442 Acute embolism and thrombosis of left tibial vein: Secondary | ICD-10-CM | POA: Diagnosis present

## 2021-12-30 DIAGNOSIS — Z6841 Body Mass Index (BMI) 40.0 and over, adult: Secondary | ICD-10-CM

## 2021-12-30 DIAGNOSIS — I2699 Other pulmonary embolism without acute cor pulmonale: Secondary | ICD-10-CM | POA: Diagnosis present

## 2021-12-30 LAB — BASIC METABOLIC PANEL
Anion gap: 10 (ref 5–15)
BUN: 14 mg/dL (ref 8–23)
CO2: 26 mmol/L (ref 22–32)
Calcium: 9.2 mg/dL (ref 8.9–10.3)
Chloride: 99 mmol/L (ref 98–111)
Creatinine, Ser: 1.02 mg/dL — ABNORMAL HIGH (ref 0.44–1.00)
GFR, Estimated: >60 mL/min (ref >60)
Glucose, Bld: 253 mg/dL — ABNORMAL HIGH (ref 70–99)
Potassium: 3 mmol/L — ABNORMAL LOW (ref 3.5–5.1)
Sodium: 135 mmol/L (ref 135–145)

## 2021-12-30 LAB — D-DIMER, QUANTITATIVE: D-Dimer, Quant: 5.61 ug/mL-FEU — ABNORMAL HIGH (ref 0.00–0.50)

## 2021-12-30 LAB — CBC
HCT: 43.1 % (ref 36.0–46.0)
Hemoglobin: 14.4 g/dL (ref 12.0–15.0)
MCH: 27.6 pg (ref 26.0–34.0)
MCHC: 33.4 g/dL (ref 30.0–36.0)
MCV: 82.6 fL (ref 80.0–100.0)
Platelets: 310 10*3/uL (ref 150–400)
RBC: 5.22 MIL/uL — ABNORMAL HIGH (ref 3.87–5.11)
RDW: 15.1 % (ref 11.5–15.5)
WBC: 17.6 10*3/uL — ABNORMAL HIGH (ref 4.0–10.5)
nRBC: 0 % (ref 0.0–0.2)

## 2021-12-30 LAB — RESP PANEL BY RT-PCR (FLU A&B, COVID) ARPGX2
Influenza A by PCR: NEGATIVE
Influenza B by PCR: NEGATIVE
SARS Coronavirus 2 by RT PCR: NEGATIVE

## 2021-12-30 LAB — TROPONIN I (HIGH SENSITIVITY)
Troponin I (High Sensitivity): 832 ng/L (ref <18)
Troponin I (High Sensitivity): 789 ng/L (ref <18)

## 2021-12-30 MED ORDER — HEPARIN BOLUS VIA INFUSION
2000.0000 [IU] | Freq: Once | INTRAVENOUS | Status: AC
Start: 2021-12-30 — End: 2021-12-30
  Administered 2021-12-30: 2000 [IU] via INTRAVENOUS

## 2021-12-30 MED ORDER — ASPIRIN 81 MG PO CHEW
324.0000 mg | CHEWABLE_TABLET | Freq: Once | ORAL | Status: AC
  Administered 2021-12-30: 324 mg via ORAL
  Filled 2021-12-30: qty 4

## 2021-12-30 MED ORDER — HEPARIN (PORCINE) 25000 UT/250ML-% IV SOLN
1650.0000 [IU]/h | INTRAVENOUS | Status: DC
  Administered 2021-12-30: 1000 [IU]/h via INTRAVENOUS
  Administered 2021-12-31 – 2022-01-01 (×3): 1550 [IU]/h via INTRAVENOUS
  Administered 2022-01-02: 1650 [IU]/h via INTRAVENOUS
  Filled 2021-12-30 (×5): qty 250

## 2021-12-30 MED ORDER — FENTANYL CITRATE PF 50 MCG/ML IJ SOSY
25.0000 ug | PREFILLED_SYRINGE | Freq: Once | INTRAMUSCULAR | Status: AC
  Administered 2021-12-31: 25 ug via INTRAVENOUS
  Filled 2021-12-30: qty 1

## 2021-12-30 MED ORDER — IOHEXOL 350 MG/ML SOLN
75.0000 mL | Freq: Once | INTRAVENOUS | Status: AC | PRN
  Administered 2021-12-30: 75 mL via INTRAVENOUS

## 2021-12-30 MED ORDER — HEPARIN BOLUS VIA INFUSION
4000.0000 [IU] | Freq: Once | INTRAVENOUS | Status: AC
  Administered 2021-12-30: 4000 [IU] via INTRAVENOUS

## 2021-12-30 NOTE — ED Notes (Signed)
ED Provider at bedside. 

## 2021-12-30 NOTE — ED Notes (Signed)
Critical trop reported at this time. 832 trop.Dr. Pearline Cables made aware. ?Report given to [facility]. ?

## 2021-12-30 NOTE — ED Triage Notes (Signed)
Sob and chest pain since 8am.  ?

## 2021-12-30 NOTE — Progress Notes (Signed)
ANTICOAGULATION CONSULT NOTE  ? ?Pharmacy Consult for heparin ?Indication: chest pain/ACS > PE ? ?Allergies  ?Allergen Reactions  ? Percocet [Oxycodone-Acetaminophen] Itching  ? ? ?Patient Measurements: ?Height: 5\' 4"  (162.6 cm) ?Weight: 128.8 kg (284 lb) ?IBW/kg (Calculated) : 54.7 ?Heparin Dosing Weight: 86.5 kg ? ?Vital Signs: ?Temp: 98.3 ?F (36.8 ?C) (03/17 1753) ?BP: 108/79 (03/17 2000) ?Pulse Rate: 95 (03/17 2000) ? ?Labs: ?Recent Labs  ?  12/30/21 ?1812 12/30/21 ?2009  ?HGB 14.4  --   ?HCT 43.1  --   ?PLT 310  --   ?CREATININE 1.02*  --   ?TROPONINIHS 832* 789*  ? ? ? ?Estimated Creatinine Clearance: 77.1 mL/min (A) (by C-G formula based on SCr of 1.02 mg/dL (H)). ? ? ?Medical History: ?Past Medical History:  ?Diagnosis Date  ? Allergy   ? Hypertension   ? Neck pain   ? Numbness   ? Thyroid disease   ? Tingling   ? ? ?Medications:  ?Patient on no anticoagulation PTA.  ? ?Assessment: ?Patient presented with chest pain. Troponin is 832. Hemoglobin 14.4, HCT 43.1.  ? ?Heparin for ACS initially started, now CT angio chest showing PE, will re-bolus and increase gtt rate ? ?Goal of Therapy:  ?Heparin level 0.3-0.7 units/ml ?Monitor platelets by anticoagulation protocol: Yes ?  ?Plan:  ?Heparin 2000 units IV x 1, and gtt at 1400 units/hr ?F/u 6 hour heparin level ? ?2010, PharmD ?Clinical Pharmacist ?ED Pharmacist Phone # [phone] ?12/30/2021 9:26 PM ? ? ? ?

## 2021-12-30 NOTE — ED Notes (Signed)
PT assisted on bedside commode. Sats 85% on RA when she got back in bed. She did go back to 96%.  ?

## 2021-12-30 NOTE — ED Provider Notes (Addendum)
?[facility] EMERGENCY DEPARTMENT ?Provider Note ? ? ?CSN: [id number] ?Arrival date & time: 12/30/21  1746 ? ?  ? ?History ? ?Chief Complaint  ?Patient presents with  ? Chest Pain  ? ? ?Victoria Petersen is a 61 y.o. female. ? ?Pt is a 61 yo female with pmh of htn and DM presenting for sob. Pt states she was at work when she developed sob and chest pain. Chest pain is sternally located, without radiation, no associated diaphoresis or nausea. No prior hx of MI. No smoking hx. No hyperlipidemia. No prior hx of DVT/PE.  ? ?The history is provided by the patient. No language interpreter was used.  ? ?  ? ?Home Medications ?Prior to Admission medications   ?Medication Sig Start Date End Date Taking? Authorizing Provider  ?amLODipine (NORVASC) 5 MG tablet Take 5 mg by mouth daily.    [provider]  ?celecoxib (CELEBREX) 100 MG capsule Take 1 capsule (100 mg total) by mouth 2 (two) times daily. 12/15/13   Levert Feinstein, MD  ?hyoscyamine (LEVSIN SL) 0.125 MG SL tablet Take 125 mg by mouth. 10/20/13   [provider]  ?losartan-hydrochlorothiazide (HYZAAR) 50-12.5 MG per tablet Take 1 tablet by mouth daily.    [provider]  ?MIMVEY 1-0.5 MG per tablet Take 1 tablet by mouth daily. 07/08/13   [provider]  ?Naproxen Sodium (ALEVE PO) Take by mouth daily.    [provider]  ?   ? ?Allergies    ?Percocet [oxycodone-acetaminophen]   ? ?Review of Systems   ?Review of Systems  ?Constitutional:  Negative for chills and fever.  ?HENT:  Negative for ear pain and sore throat.   ?Eyes:  Negative for pain and visual disturbance.  ?Respiratory:  Positive for chest tightness and shortness of breath. Negative for cough.   ?Cardiovascular:  Positive for chest pain. Negative for palpitations.  ?Gastrointestinal:  Negative for abdominal pain and vomiting.  ?Genitourinary:  Negative for dysuria and hematuria.  ?Musculoskeletal:  Negative for arthralgias and back pain.  ?Skin:  Negative for  color change and rash.  ?Neurological:  Negative for seizures and syncope.  ?All other systems reviewed and are negative. ? ?Physical Exam ?Updated Vital Signs ?BP 118/82   Pulse 93   Temp 98.3 ?F (36.8 ?C)   Resp (!) 23   Ht 5\' 4"  (1.626 m)   Wt 128.8 kg   SpO2 96%   BMI 48.75 kg/m?  ?Physical Exam ?Vitals and nursing note reviewed.  ?Constitutional:   ?   General: She is not in acute distress. ?   Appearance: She is well-developed.  ?HENT:  ?   Head: Normocephalic and atraumatic.  ?Eyes:  ?   Conjunctiva/sclera: Conjunctivae normal.  ?Cardiovascular:  ?   Rate and Rhythm: Normal rate and regular rhythm.  ?   Heart sounds: No murmur heard. ?Pulmonary:  ?   Effort: Pulmonary effort is normal. No respiratory distress.  ?   Breath sounds: Normal breath sounds.  ?Abdominal:  ?   Palpations: Abdomen is soft.  ?   Tenderness: There is no abdominal tenderness.  ?Musculoskeletal:     ?   General: No swelling.  ?   Cervical back: Neck supple.  ?Skin: ?   General: Skin is warm and dry.  ?   Capillary Refill: Capillary refill takes less than 2 seconds.  ?Neurological:  ?   Mental Status: She is alert.  ?Psychiatric:     ?   Mood  and Affect: Mood normal.  ? ? ?ED Results / Procedures / Treatments   ?Labs ?(all labs ordered are listed, but only abnormal results are displayed) ?Labs Reviewed  ?BASIC METABOLIC PANEL - Abnormal; Notable for the following components:  ?    Result Value  ? Potassium 3.0 (*)   ? Glucose, Bld 253 (*)   ? Creatinine, Ser 1.02 (*)   ? All other components within normal limits  ?CBC - Abnormal; Notable for the following components:  ? WBC 17.6 (*)   ? RBC 5.22 (*)   ? All other components within normal limits  ?D-DIMER, QUANTITATIVE - Abnormal; Notable for the following components:  ? D-Dimer, Quant 5.61 (*)   ? All other components within normal limits  ?TROPONIN I (HIGH SENSITIVITY) - Abnormal; Notable for the following components:  ? Troponin I (High Sensitivity) 832 (*)   ? All other  components within normal limits  ?TROPONIN I (HIGH SENSITIVITY) - Abnormal; Notable for the following components:  ? Troponin I (High Sensitivity) 789 (*)   ? All other components within normal limits  ?RESP PANEL BY RT-PCR (FLU A&B, COVID) [id number]  ?HEPARIN LEVEL (UNFRACTIONATED)  ? ? ?EKG ?EKG Interpretation ? 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 ?Radiology ?DG Chest 2 View ? ?Result Date: 12/30/2021 ?CLINICAL DATA:  Chest pain. EXAM: CHEST - 2 VIEW COMPARISON:  04/30/2020 FINDINGS: Normal heart size. Stable mediastinal contours. The lungs are clear. Pulmonary vasculature is normal. No consolidation, pleural effusion, or pneumothorax. No acute osseous abnormalities are seen. IMPRESSION: No acute chest findings. Electronically Signed   By: Narda Rutherford M.D.   On: 12/30/2021 18:27  ? ?CT Angio Chest PE W and/or Wo Contrast ? ?Result Date: 12/30/2021 ?CLINICAL DATA:  Shortness of breath and chest pain. High probability pulmonary embolism suspected. EXAM: CT ANGIOGRAPHY CHEST WITH CONTRAST TECHNIQUE: Multidetector CT imaging of the chest was performed using the standard protocol during bolus administration of intravenous contrast. Multiplanar CT image reconstructions and MIPs were obtained to evaluate the vascular anatomy. RADIATION DOSE REDUCTION: This exam was performed according to the departmental dose-optimization program which includes automated exposure control, adjustment of the mA and/or kV according to patient size and/or use of iterative reconstruction technique. CONTRAST:  45mL OMNIPAQUE IOHEXOL 350 MG/ML SOLN COMPARISON:  Chest two views 12/30/2021 FINDINGS: Cardiovascular: Main pulmonary artery is opacified up to 285 Hounsfield units. There  are multiple filling defects indicating right-greater-than-left pulmonary emboli. This includes a nearly occlusive thrombus within the right upper lobe pulmonary artery and more distal pulmonary arteries, moderate filling defects within the medial and lateral segments of the right middle lobe and more distal pulmonary arteries, high-grade partial filling defects within the right lower lobe pulmonary artery and additional more distal filling defects and filling defects within the apicoposterior segment of the left upper lobe pulmonary artery and likely within the distal subsegmental left lower lobe pulmonary arteries. There is no interventricular septal bowing to indicate CT evidence of right-sided heart strain. No pericardial effusion. Heart size is mildly enlarged. No thoracic aortic aneurysm. Mediastinum/Nodes: No axillary, mediastinal or hilar pathologically enlarged lymph nodes by CT criteria. The visualized thyroid lobe is unremarkable. The esophagus follows a normal course of normal caliber. Lungs/Pleura: The central  airways are patent. Mild bilateral posterior dependent subsegmental atelectasis. No pleural effusion or pneumothorax. Upper Abdomen: No acute abnormality. Musculoskeletal: Mild-to-moderate multilevel degenerative disc changes of the thoracic spine. Review of the MIP images confirms the above findings. IMPRESSION:: IMPRESSION: 1. There are filling defects indicating pulmonary emboli within the right upper lobe lobar, medial and lateral segments of the right middle lobe, right lower lobe lobar, and apicoposterior segment left upper lobe pulmonary arteries. No CT evidence of right-sided heart strain. 2. No acute lung process. Critical Value/emergent results were called by telephone at the time of interpretation on 12/30/2021 at 9:13 pm to provider Edwin DadaALICIA Edgar Reisz , who verbally acknowledged these results. Electronically Signed   By: Neita Garnetonald  Viola M.D.   On: 12/30/2021 21:14   ? ?Procedures ?Marland Kitchen.Critical  Care ?Performed by: Franne FortsGray, Rhilee Currin P, DO ?Authorized by: Franne FortsGray, Berkeley Vanaken P, DO  ? ?Critical care provider statement:  ?  Critical care time (minutes):  104 ?  Critical care was necessary to treat or prevent imminent or life-th

## 2021-12-30 NOTE — Progress Notes (Signed)
ANTICOAGULATION CONSULT NOTE - Initial Consult ? ?Pharmacy Consult for heparin ?Indication: chest pain/ACS ? ?Allergies  ?Allergen Reactions  ? Percocet [Oxycodone-Acetaminophen] Itching  ? ? ?Patient Measurements: ?Height: 5\' 4"  (162.6 cm) ?Weight: 128.8 kg (284 lb) ?IBW/kg (Calculated) : 54.7 ?Heparin Dosing Weight: 86.5 kg ? ?Vital Signs: ?Temp: 98.3 ?F (36.8 ?C) (03/17 1753) ?BP: 122/90 (03/17 1947) ?Pulse Rate: 94 (03/17 1947) ? ?Labs: ?Recent Labs  ?  12/30/21 ?1812  ?HGB 14.4  ?HCT 43.1  ?PLT 310  ?CREATININE 1.02*  ?TROPONINIHS 832*  ? ? ?Estimated Creatinine Clearance: 77.1 mL/min (A) (by C-G formula based on SCr of 1.02 mg/dL (H)). ? ? ?Medical History: ?Past Medical History:  ?Diagnosis Date  ? Allergy   ? Hypertension   ? Neck pain   ? Numbness   ? Thyroid disease   ? Tingling   ? ? ?Medications:  ?Patient on no anticoagulation PTA.  ? ?Assessment: ?Patient presented with chest pain. Troponin is 832. Hemoglobin 14.4, HCT 43.1.  ? ?Goal of Therapy:  ?Heparin level 0.3-0.7 units/ml ?Monitor platelets by anticoagulation protocol: Yes ?  ?Plan:  ?Bolus 4000 units of heparin ?Start heparin at 1000 units/hr ?6 hour heparin level ?Daily heparin level and CBC ? ?Thank you for allowing pharmacy to participate in this patient's care. ? ?01/01/22, PharmD ?PGY1 Pharmacy Resident ?12/30/2021 8:02 PM ?Check [email or web address] for unit specific pharmacy number ? ? ? ?

## 2021-12-31 ENCOUNTER — Inpatient Hospital Stay (HOSPITAL_COMMUNITY): Payer: Managed Care, Other (non HMO)

## 2021-12-31 ENCOUNTER — Encounter (HOSPITAL_COMMUNITY): Payer: Self-pay | Admitting: Cardiovascular Disease

## 2021-12-31 DIAGNOSIS — I10 Essential (primary) hypertension: Secondary | ICD-10-CM | POA: Diagnosis present

## 2021-12-31 DIAGNOSIS — I214 Non-ST elevation (NSTEMI) myocardial infarction: Secondary | ICD-10-CM | POA: Diagnosis present

## 2021-12-31 DIAGNOSIS — E039 Hypothyroidism, unspecified: Secondary | ICD-10-CM | POA: Diagnosis present

## 2021-12-31 DIAGNOSIS — I2699 Other pulmonary embolism without acute cor pulmonale: Secondary | ICD-10-CM | POA: Diagnosis present

## 2021-12-31 DIAGNOSIS — I82452 Acute embolism and thrombosis of left peroneal vein: Secondary | ICD-10-CM | POA: Diagnosis present

## 2021-12-31 DIAGNOSIS — E785 Hyperlipidemia, unspecified: Secondary | ICD-10-CM | POA: Diagnosis present

## 2021-12-31 DIAGNOSIS — Z79899 Other long term (current) drug therapy: Secondary | ICD-10-CM | POA: Diagnosis not present

## 2021-12-31 DIAGNOSIS — Z8249 Family history of ischemic heart disease and other diseases of the circulatory system: Secondary | ICD-10-CM | POA: Diagnosis not present

## 2021-12-31 DIAGNOSIS — E1165 Type 2 diabetes mellitus with hyperglycemia: Secondary | ICD-10-CM | POA: Diagnosis present

## 2021-12-31 DIAGNOSIS — Z20822 Contact with and (suspected) exposure to covid-19: Secondary | ICD-10-CM | POA: Diagnosis present

## 2021-12-31 DIAGNOSIS — I82442 Acute embolism and thrombosis of left tibial vein: Secondary | ICD-10-CM | POA: Diagnosis present

## 2021-12-31 DIAGNOSIS — Z6841 Body Mass Index (BMI) 40.0 and over, adult: Secondary | ICD-10-CM | POA: Diagnosis not present

## 2021-12-31 DIAGNOSIS — I2694 Multiple subsegmental pulmonary emboli without acute cor pulmonale: Secondary | ICD-10-CM | POA: Diagnosis present

## 2021-12-31 DIAGNOSIS — Z888 Allergy status to other drugs, medicaments and biological substances status: Secondary | ICD-10-CM | POA: Diagnosis not present

## 2021-12-31 LAB — ECHOCARDIOGRAM COMPLETE
AR max vel: 2.6 cm2
AV Area VTI: 2.87 cm2
AV Area mean vel: 2.66 cm2
AV Mean grad: 4 mmHg
AV Peak grad: 7.8 mmHg
Ao pk vel: 1.4 m/s
Area-P 1/2: 3.65 cm2
Height: 64 in
S' Lateral: 2.4 cm
Weight: 4499.2 oz

## 2021-12-31 LAB — LIPID PANEL
Cholesterol: 176 mg/dL (ref 0–200)
HDL: 45 mg/dL (ref >40)
LDL Cholesterol: 111 mg/dL — ABNORMAL HIGH (ref 0–99)
Total CHOL/HDL Ratio: 3.9 RATIO
Triglycerides: 99 mg/dL (ref <150)
VLDL: 20 mg/dL (ref 0–40)

## 2021-12-31 LAB — HIV ANTIBODY (ROUTINE TESTING W REFLEX): HIV Screen 4th Generation wRfx: NONREACTIVE

## 2021-12-31 LAB — BASIC METABOLIC PANEL
Anion gap: 10 (ref 5–15)
BUN: 8 mg/dL (ref 8–23)
CO2: 26 mmol/L (ref 22–32)
Calcium: 9 mg/dL (ref 8.9–10.3)
Chloride: 104 mmol/L (ref 98–111)
Creatinine, Ser: 0.8 mg/dL (ref 0.44–1.00)
GFR, Estimated: >60 mL/min (ref >60)
Glucose, Bld: 213 mg/dL — ABNORMAL HIGH (ref 70–99)
Potassium: 3 mmol/L — ABNORMAL LOW (ref 3.5–5.1)
Sodium: 140 mmol/L (ref 135–145)

## 2021-12-31 LAB — CBC
HCT: 39.4 % (ref 36.0–46.0)
Hemoglobin: 13.2 g/dL (ref 12.0–15.0)
MCH: 27.7 pg (ref 26.0–34.0)
MCHC: 33.5 g/dL (ref 30.0–36.0)
MCV: 82.6 fL (ref 80.0–100.0)
Platelets: 280 10*3/uL (ref 150–400)
RBC: 4.77 MIL/uL (ref 3.87–5.11)
RDW: 14.9 % (ref 11.5–15.5)
WBC: 14.4 10*3/uL — ABNORMAL HIGH (ref 4.0–10.5)
nRBC: 0 % (ref 0.0–0.2)

## 2021-12-31 LAB — HEPARIN LEVEL (UNFRACTIONATED)
Heparin Unfractionated: 0.33 IU/mL (ref 0.30–0.70)
Heparin Unfractionated: 0.45 IU/mL (ref 0.30–0.70)

## 2021-12-31 LAB — GLUCOSE, CAPILLARY
Glucose-Capillary: 186 mg/dL — ABNORMAL HIGH (ref 70–99)
Glucose-Capillary: 178 mg/dL — ABNORMAL HIGH (ref 70–99)
Glucose-Capillary: 163 mg/dL — ABNORMAL HIGH (ref 70–99)

## 2021-12-31 MED ORDER — ATORVASTATIN CALCIUM 40 MG PO TABS
40.0000 mg | ORAL_TABLET | Freq: Every day | ORAL | Status: DC
  Administered 2021-12-31 – 2022-01-03 (×4): 40 mg via ORAL
  Filled 2021-12-31 (×4): qty 1

## 2021-12-31 MED ORDER — TRAMADOL HCL 50 MG PO TABS
50.0000 mg | ORAL_TABLET | Freq: Four times a day (QID) | ORAL | Status: DC | PRN
  Administered 2021-12-31 – 2022-01-02 (×5): 50 mg via ORAL
  Filled 2021-12-31 (×5): qty 1

## 2021-12-31 MED ORDER — LINAGLIPTIN 5 MG PO TABS
5.0000 mg | ORAL_TABLET | Freq: Every day | ORAL | Status: DC
  Administered 2022-01-01 – 2022-01-03 (×3): 5 mg via ORAL
  Filled 2021-12-31 (×3): qty 1

## 2021-12-31 MED ORDER — HYDRALAZINE HCL 25 MG PO TABS
12.5000 mg | ORAL_TABLET | Freq: Two times a day (BID) | ORAL | Status: DC
  Administered 2021-12-31 – 2022-01-03 (×6): 12.5 mg via ORAL
  Filled 2021-12-31 (×6): qty 1

## 2021-12-31 MED ORDER — HYDROCHLOROTHIAZIDE 12.5 MG PO TABS
12.5000 mg | ORAL_TABLET | Freq: Every day | ORAL | Status: DC
  Administered 2022-01-01 – 2022-01-03 (×3): 12.5 mg via ORAL
  Filled 2021-12-31 (×4): qty 1

## 2021-12-31 MED ORDER — ACETAMINOPHEN 325 MG PO TABS
650.0000 mg | ORAL_TABLET | ORAL | Status: DC | PRN
  Administered 2021-12-31: 650 mg via ORAL
  Filled 2021-12-31: qty 2

## 2021-12-31 MED ORDER — CEPHALEXIN 500 MG PO CAPS
500.0000 mg | ORAL_CAPSULE | Freq: Three times a day (TID) | ORAL | Status: DC
  Administered 2021-12-31 – 2022-01-03 (×10): 500 mg via ORAL
  Filled 2021-12-31 (×10): qty 1

## 2021-12-31 MED ORDER — POTASSIUM CHLORIDE ER 10 MEQ PO TBCR
40.0000 meq | EXTENDED_RELEASE_TABLET | Freq: Two times a day (BID) | ORAL | Status: AC
  Administered 2021-12-31 (×2): 40 meq via ORAL
  Filled 2021-12-31 (×4): qty 4

## 2021-12-31 MED ORDER — ORAL CARE MOUTH RINSE
15.0000 mL | Freq: Two times a day (BID) | OROMUCOSAL | Status: DC
  Administered 2021-12-31 – 2022-01-03 (×4): 15 mL via OROMUCOSAL

## 2021-12-31 MED ORDER — DILTIAZEM HCL ER COATED BEADS 180 MG PO CP24
360.0000 mg | ORAL_CAPSULE | Freq: Every day | ORAL | Status: DC
  Administered 2022-01-01 – 2022-01-03 (×3): 360 mg via ORAL
  Filled 2021-12-31 (×3): qty 2

## 2021-12-31 MED ORDER — ONDANSETRON HCL 4 MG/2ML IJ SOLN: 4.0000 mg | Freq: Four times a day (QID) | INTRAMUSCULAR | Status: DC | PRN

## 2021-12-31 MED ORDER — METOPROLOL SUCCINATE ER 25 MG PO TB24
25.0000 mg | ORAL_TABLET | Freq: Every day | ORAL | Status: DC
  Administered 2021-12-31 – 2022-01-01 (×2): 25 mg via ORAL
  Filled 2021-12-31 (×2): qty 1

## 2021-12-31 MED ORDER — ASPIRIN EC 81 MG PO TBEC
81.0000 mg | DELAYED_RELEASE_TABLET | Freq: Every day | ORAL | Status: DC
  Administered 2022-01-01 – 2022-01-03 (×3): 81 mg via ORAL
  Filled 2021-12-31 (×3): qty 1

## 2021-12-31 MED ORDER — NITROGLYCERIN 0.4 MG SL SUBL: 0.4000 mg | SUBLINGUAL_TABLET | SUBLINGUAL | Status: DC | PRN

## 2021-12-31 NOTE — ED Notes (Addendum)
PT's sats dropping to 87% when asleep. No respiratory distress. 2 L Waldron applied while she rests.  ?

## 2021-12-31 NOTE — H&P (Signed)
Referring Physician: Gasper Sells, DO ? ?Victoria Petersen is an 61 y.o. female.                       ?Chief Complaint: Shortness of breath/Chest pain ? ?HPI: 60 years old black female with PMH of HTN, obesity and thyroid disease has sudden onset of shortness of breath followed by chest pain at work yesterday. She has no history of smoking, prior MI or Hyperlipidemia. She has sedentary life-style. ? ?Past Medical History:  ?Diagnosis Date  ? Allergy   ? Hypertension   ? Neck pain   ? Numbness   ? Thyroid disease   ? Tingling   ?  ? ? ?Past Surgical History:  ?Procedure Laterality Date  ? CESAREAN SECTION    ? x2  ? ? ?Family History  ?Problem Relation Age of Onset  ? Hypertension Mother   ? Hypertension Father   ? Hypertension Sister   ? ?Social History:  reports that she has never smoked. She has never used smokeless tobacco. She reports that she does not drink alcohol and does not use drugs. ? ?Allergies:  ?Allergies  ?Allergen Reactions  ? Percocet [Oxycodone-Acetaminophen] Itching  ? ? ?Medications Prior to Admission  ?Medication Sig Dispense Refill  ? amLODipine (NORVASC) 5 MG tablet Take 5 mg by mouth daily.    ? celecoxib (CELEBREX) 100 MG capsule Take 1 capsule (100 mg total) by mouth 2 (two) times daily. 60 capsule 12  ? hyoscyamine (LEVSIN SL) 0.125 MG SL tablet Take 125 mg by mouth.    ? losartan-hydrochlorothiazide (HYZAAR) 50-12.5 MG per tablet Take 1 tablet by mouth daily.    ? MIMVEY 1-0.5 MG per tablet Take 1 tablet by mouth daily.    ? Naproxen Sodium (ALEVE PO) Take by mouth daily.    ? ? ?Results for orders placed or performed during the hospital encounter of 12/30/21 (from the past 48 hour(s))  ?Basic metabolic panel     Status: Abnormal  ? Collection Time: 12/30/21  6:12 PM  ?Result Value Ref Range  ? Sodium 135 135 - 145 mmol/L  ? Potassium 3.0 (L) 3.5 - 5.1 mmol/L  ? Chloride 99 98 - 111 mmol/L  ? CO2 26 22 - 32 mmol/L  ? Glucose, Bld 253 (H) 70 - 99 mg/dL  ?  Comment: Glucose reference range  applies only to samples taken after fasting for at least 8 hours.  ? BUN 14 8 - 23 mg/dL  ? Creatinine, Ser 1.02 (H) 0.44 - 1.00 mg/dL  ? Calcium 9.2 8.9 - 10.3 mg/dL  ? GFR, Estimated >60 >60 mL/min  ?  Comment: (NOTE) ?Calculated using the CKD-EPI Creatinine Equation (2021) ?  ? Anion gap 10 5 - 15  ?  Comment: Performed at [facility], [address], [place], [place] [address]  ?CBC     Status: Abnormal  ? Collection Time: 12/30/21  6:12 PM  ?Result Value Ref Range  ? WBC 17.6 (H) 4.0 - 10.5 K/uL  ? RBC 5.22 (H) 3.87 - 5.11 MIL/uL  ? Hemoglobin 14.4 12.0 - 15.0 g/dL  ? HCT 43.1 36.0 - 46.0 %  ? MCV 82.6 80.0 - 100.0 fL  ? MCH 27.6 26.0 - 34.0 pg  ? MCHC 33.4 30.0 - 36.0 g/dL  ? RDW 15.1 11.5 - 15.5 %  ? Platelets 310 150 - 400 K/uL  ? nRBC 0.0 0.0 - 0.2 %  ?  Comment:  Performed at [facility] [place], [address] [place], [place] [address]  ?Troponin I (High Sensitivity)     Status: Abnormal  ? Collection Time: 12/30/21  6:12 PM  ?Result Value Ref Range  ? Troponin I (High Sensitivity) 832 (HH) <18 ng/L  ?  Comment: CRITICAL RESULT CALLED TO, READ BACK BY AND VERIFIED WITH: ?BRITTNEY BASSETT RN ON 12/30/21 AT [facility] ?(NOTE) ?Elevated high sensitivity troponin I (hsTnI) values and significant  ?changes across serial measurements may suggest ACS but many other  ?chronic and acute conditions are known to elevate hsTnI results.  ?Refer to the Links section for chest pain algorithms and additional  ?guidance. ?Performed at [facility] [place], [address] [place], [place] [address] ?  ?D-dimer, quantitative     Status: Abnormal  ? Collection Time: 12/30/21  6:12 PM  ?Result Value Ref Range  ? D-Dimer, Quant 5.61 (H) 0.00 - 0.50 ug/mL-FEU  ?  Comment: (NOTE) ?At the manufacturer cut-off value of 0.5 ?g/mL FEU, this assay has a ?negative predictive value of 95-100%.This assay is intended for use ?in conjunction with a clinical pretest probability (PTP) assessment ?model to exclude  pulmonary embolism (PE) and deep venous thrombosis ?(DVT) in outpatients suspected of PE or DVT. ?Results should be correlated with clinical presentation. ?Performed at [facility] [place], [address] [place], [place] [address] ?  ?Resp Panel by RT-PCR (Flu A&B, Covid) Nasopharyngeal Swab     Status: None  ? Collection Time: 12/30/21  6:12 PM  ? Specimen: Nasopharyngeal Swab; Nasopharyngeal(NP) swabs in vial transport medium  ?Result Value Ref Range  ? SARS Coronavirus 2 by RT PCR NEGATIVE NEGATIVE  ?  Comment: (NOTE) ?SARS-CoV-2 target nucleic acids are NOT DETECTED. ? ?The SARS-CoV-2 RNA is generally detectable in upper respiratory ?specimens during the acute phase of infection. The lowest ?concentration of SARS-CoV-2 viral copies this assay can detect is ?138 copies/mL. A negative result does not preclude SARS-Cov-2 ?infection and should not be used as the sole basis for treatment or ?other patient management decisions. A negative result may occur with  ?improper specimen collection/handling, submission of specimen other ?than nasopharyngeal swab, presence of viral mutation(s) within the ?areas targeted by this assay, and inadequate number of viral ?copies(<138 copies/mL). A negative result must be combined with ?clinical observations, patient history, and epidemiological ?information. The expected result is Negative. ? ?Fact Sheet for Patients:  ?BloggerCourse.comhttps://www.fda.gov/media/152166/download ? ?Fact Sheet for Healthcare Providers:  ?SeriousBroker.ithttps://www.fda.gov/media/152162/download ? ?This test is no t yet approved or cleared by the [place] FDA and  ?has been authorized for detection and/or diagnosis of SARS-CoV-2 by ?FDA under an Emergency Use Authorization (EUA). This EUA will remain  ?in effect (meaning this test can be used) for the duration of the ?COVID-19 declaration under Section 564(b)(1) of the Act, 21 ?U.S.C.section 360bbb-3(b)(1), unless the authorization is terminated  ?or revoked sooner.  ? ? ?   ? Influenza A by PCR NEGATIVE NEGATIVE  ? Influenza B by PCR NEGATIVE NEGATIVE  ?  Comment: (NOTE) ?The Xpert Xpress SARS-CoV-2/FLU/RSV plus assay is intended as an aid ?in the diagnosis of influenza from Nasopharyngeal swab specimens and ?should not be used as a sole basis for treatment. Nasal washings and ?aspirates are unacceptable for Xpert Xpress SARS-CoV-2/FLU/RSV ?testing. ? ?Fact Sheet for Patients: ?BloggerCourse.comhttps://www.fda.gov/media/152166/download ? ?Fact Sheet for Healthcare Providers: ?SeriousBroker.ithttps://www.fda.gov/media/152162/download ? ?This test is not yet approved or cleared by the [place] FDA and ?has been authorized for detection  and/or diagnosis of SARS-CoV-2 by ?FDA under an Emergency Use Authorization (EUA). This EUA will remain ?in effect (meaning this test can be used) for the duration of the ?COVID-19 declaration under Section 564(b)(1) of the Act, 21 U.S.C. ?section 360bbb-3(b)(1), unless the authorization is terminated or ?revoked. ? ?Performed at [facility], [address] [place], [place] [address] ?  ?Troponin I (High Sensitivity)     Status: Abnormal  ? Collection Time: 12/30/21  8:09 PM  ?Result Value Ref Range  ? Troponin I (High Sensitivity) 789 (HH) <18 ng/L  ?  Comment: CRITICAL RESULT CALLED TO, READ BACK BY AND VERIFIED WITH: ?DELTA CHECK NOTED ? NEAL,K RN @2043  12/30/21 EDENSCA ?(NOTE) ?Elevated high sensitivity troponin I (hsTnI) values and significant  ?changes across serial measurements may suggest ACS but many other  ?chronic and acute conditions are known to elevate hsTnI results.  ?Refer to the Links section for chest pain algorithms and additional  ?guidance. ?Performed at [facility], [address] [place], [place] ?  ? ?DG Chest 2 View ? ?Result Date: 12/30/2021 ?CLINICAL DATA:  Chest pain. EXAM: CHEST - 2 VIEW COMPARISON:  04/30/2020 FINDINGS: Normal heart size. Stable mediastinal contours. The lungs are clear. Pulmonary vasculature is  normal. No consolidation, pleural effusion, or pneumothorax. No acute osseous abnormalities are seen. IMPRESSION: No acute chest findings. Electronically Signed   By: 05/02/2020 M.D.   On: 12/30/2021 18:27  ?

## 2021-12-31 NOTE — Progress Notes (Signed)
?  Echocardiogram ?2D Echocardiogram has been performed. ? ?Gerda Diss ?12/31/2021, 2:41 PM ?

## 2021-12-31 NOTE — Progress Notes (Signed)
ANTICOAGULATION CONSULT NOTE  ? ?Pharmacy Consult for heparin ?Indication: chest pain/ACS > PE ? ?Allergies  ?Allergen Reactions  ? Percocet [Oxycodone-Acetaminophen] Itching  ? ? ?Patient Measurements: ?Height: 5\' 4"  (162.6 cm) ?Weight: 127.6 kg (281 lb 3.2 oz) ?IBW/kg (Calculated) : 54.7 ?Heparin Dosing Weight: 86.5 kg ? ?Vital Signs: ?Temp: 97.7 ?F (36.5 ?C) (03/18 1143) ?Temp Source: Oral (03/18 1143) ?BP: 144/93 (03/18 1143) ?Pulse Rate: 85 (03/18 1143) ? ?Labs: ?Recent Labs  ?  12/30/21 ?1812 12/30/21 ?2009 12/31/21 ?0405 12/31/21 ?1143  ?HGB 14.4  --  13.2  --   ?HCT 43.1  --  39.4  --   ?PLT 310  --  280  --   ?HEPARINUNFRC  --   --  0.33 0.45  ?CREATININE 1.02*  --  0.80  --   ?TROPONINIHS 832* 789*  --   --   ? ? ? ?Estimated Creatinine Clearance: 97.8 mL/min (by C-G formula based on SCr of 0.8 mg/dL). ? ? ?Medical History: ?Past Medical History:  ?Diagnosis Date  ? Allergy   ? Hypertension   ? Neck pain   ? Numbness   ? Thyroid disease   ? Tingling   ? ? ?Medications:  ?Patient on no anticoagulation PTA.  ? ?Assessment: ?Patient presented with chest pain. Troponin is 832. Heparin for ACS initially started, now CT angio chest showing PE. Not on anticoagulation PTA. ? ?Heparin level therapeutic at 0.45 on 1550 units/hr. CBC stable with Hgb 13.2 and PLT 280. No s/sx of bleeding noted.  ? ?Goal of Therapy:  ?Heparin level 0.3-0.7 units/ml ?Monitor platelets by anticoagulation protocol: Yes ?  ?Plan:  ?Continue heparin infusion at 1550 units/hr ?F/up heparin level with AM labs ?Monitor CBC, heparin level, and s/sx of bleeding daily  ? ?01/02/22, Pharm.D. ?PGY-1 Pharmacy Resident ?Phone:[phone] ?12/31/2021 12:30 PM ? ?

## 2021-12-31 NOTE — Progress Notes (Signed)
ANTICOAGULATION CONSULT NOTE - Follow Up Consult ? ?Pharmacy Consult for heparin ?Indication: PE ? ?Allergies  ?Allergen Reactions  ? Percocet [Oxycodone-Acetaminophen] Itching  ? ? ?Patient Measurements: ?Height: 5\' 4"  (162.6 cm) ?Weight: 127.6 kg (281 lb 3.2 oz) ?IBW/kg (Calculated) : 54.7 ?Heparin Dosing Weight: 87 kg ? ?Vital Signs: ?Temp: 98.2 ?F (36.8 ?C) (03/18 0208) ?Temp Source: Oral (03/18 0208) ?BP: 132/96 (03/18 0334) ?Pulse Rate: 83 (03/18 0334) ? ?Labs: ?Recent Labs  ?  12/30/21 ?1812 12/30/21 ?2009 12/31/21 ?0405  ?HGB 14.4  --  13.2  ?HCT 43.1  --  39.4  ?PLT 310  --  280  ?HEPARINUNFRC  --   --  0.33  ?CREATININE 1.02*  --  0.80  ?TROPONINIHS 832* 789*  --   ? ? ?Estimated Creatinine Clearance: 97.8 mL/min (by C-G formula based on SCr of 0.8 mg/dL). ? ?Assessment: ?Patient presented with chest pain. Troponin is 832. Hemoglobin 14.4, HCT 43.1.  Heparin for ACS initially started, now CT angio chest showing PE. ? ?Heparin level returned on the low end of therapeutic at 0.33 on 1400 units/hr. ? ?Goal of Therapy:  ?Heparin level 0.3-0.7 units/ml ?Monitor platelets by anticoagulation protocol: Yes ?  ?Plan:  ?Increase heparin infusion to 1550 units/hr ?Check anti-Xa level in 6 hours and daily while on heparin ?Continue to monitor H&H and platelets ? ?01/02/22, PharmD Candidate [phone] ?12/31/2021,5:11 AM ? ? ?

## 2022-01-01 ENCOUNTER — Inpatient Hospital Stay (HOSPITAL_COMMUNITY): Payer: Managed Care, Other (non HMO)

## 2022-01-01 DIAGNOSIS — I2699 Other pulmonary embolism without acute cor pulmonale: Secondary | ICD-10-CM

## 2022-01-01 LAB — CBC
HCT: 38.5 % (ref 36.0–46.0)
Hemoglobin: 13 g/dL (ref 12.0–15.0)
MCH: 28.3 pg (ref 26.0–34.0)
MCHC: 33.8 g/dL (ref 30.0–36.0)
MCV: 83.9 fL (ref 80.0–100.0)
Platelets: 283 10*3/uL (ref 150–400)
RBC: 4.59 MIL/uL (ref 3.87–5.11)
RDW: 15.3 % (ref 11.5–15.5)
WBC: 16 10*3/uL — ABNORMAL HIGH (ref 4.0–10.5)
nRBC: 0 % (ref 0.0–0.2)

## 2022-01-01 LAB — GLUCOSE, CAPILLARY
Glucose-Capillary: 155 mg/dL — ABNORMAL HIGH (ref 70–99)
Glucose-Capillary: 158 mg/dL — ABNORMAL HIGH (ref 70–99)
Glucose-Capillary: 200 mg/dL — ABNORMAL HIGH (ref 70–99)
Glucose-Capillary: 202 mg/dL — ABNORMAL HIGH (ref 70–99)
Glucose-Capillary: 219 mg/dL — ABNORMAL HIGH (ref 70–99)

## 2022-01-01 LAB — TROPONIN I (HIGH SENSITIVITY): Troponin I (High Sensitivity): 117 ng/L (ref <18)

## 2022-01-01 LAB — HEPARIN LEVEL (UNFRACTIONATED): Heparin Unfractionated: 0.44 IU/mL (ref 0.30–0.70)

## 2022-01-01 LAB — BASIC METABOLIC PANEL
Anion gap: 8 (ref 5–15)
BUN: 15 mg/dL (ref 8–23)
CO2: 26 mmol/L (ref 22–32)
Calcium: 9.1 mg/dL (ref 8.9–10.3)
Chloride: 103 mmol/L (ref 98–111)
Creatinine, Ser: 0.89 mg/dL (ref 0.44–1.00)
GFR, Estimated: >60 mL/min (ref >60)
Glucose, Bld: 167 mg/dL — ABNORMAL HIGH (ref 70–99)
Potassium: 3.8 mmol/L (ref 3.5–5.1)
Sodium: 137 mmol/L (ref 135–145)

## 2022-01-01 MED ORDER — METOPROLOL SUCCINATE ER 25 MG PO TB24
25.0000 mg | ORAL_TABLET | Freq: Once | ORAL | Status: AC
Start: 2022-01-01 — End: 2022-01-01
  Administered 2022-01-01: 25 mg via ORAL
  Filled 2022-01-01: qty 1

## 2022-01-01 MED ORDER — METOPROLOL SUCCINATE ER 50 MG PO TB24
50.0000 mg | ORAL_TABLET | Freq: Every day | ORAL | Status: DC
  Administered 2022-01-02 – 2022-01-03 (×2): 50 mg via ORAL
  Filled 2022-01-01 (×2): qty 1

## 2022-01-01 NOTE — Progress Notes (Signed)
Pt reported wheezing and coughed up clear sputum x 1 after using bedside commode.   Upon auscultation, no wheezes appreciated.  Pt then reported right side chest pain.  EKG obtained and per patient pain relieved with oxygen application at 2L per [place] and elevating legs on a pillow.  Will continue to monitor closely. ?

## 2022-01-01 NOTE — Consult Note (Signed)
Ref: Laqueta Due., MD ? ? ?Subjective:  ?Patient is feeling better.  ?Improving respiratory distress with supplemental oxygenation. ?Labs pending for this AM. ?Echocardiogram shows normal LV systolic function with severe LVH. ? ?Objective:  ?Vital Signs in the last 24 hours: ?Temp:  [97.7 ?F (36.5 ?C)-98.7 ?F (37.1 ?C)] 98.4 ?F (36.9 ?C) (03/19 0449) ?Pulse Rate:  [59-85] 81 (03/19 0952) ?Cardiac Rhythm: Normal sinus rhythm;Bundle branch block (03/19 0816) ?Resp:  [16-20] 16 (03/19 0449) ?BP: (109-152)/(91-104) 140/94 (03/19 0950) ?SpO2:  [97 %-100 %] 100 % (03/19 0449) ? ?Physical Exam: ?BP Readings from Last 1 Encounters:  ?01/01/22 (!) 140/94  ?   ?Wt Readings from Last 1 Encounters:  ?12/31/21 127.6 kg  ?  Weight change:  Body mass index is 48.27 kg/m?. ?HEENT: [place]/AT, Eyes-Brown, Conjunctiva-Pink, Sclera-Non-icteric ?Neck: No JVD, No bruit, Trachea midline. ?Lungs:  Clear, Bilateral. ?Cardiac:  Regular rhythm, normal S1 and S2, no S3. II/VI systolic murmur. ?Abdomen:  Soft, non-tender. BS present. ?Extremities:  No edema present. No cyanosis. No clubbing. ?CNS: AxOx3, Cranial nerves grossly intact, moves all 4 extremities.  ?Skin: Warm and dry. ? ? ?Intake/Output from previous day: ?03/18 0701 - 03/19 0700 ?In: 645.5 [P.O.:300; I.V.:345.5] ?Out: 600 [Urine:600] ? ? ? ?Lab Results: ?BMET ?   ?Component Value Date/Time  ? NA 140 12/31/2021 0405  ? NA 135 12/30/2021 1812  ? K 3.0 (L) 12/31/2021 0405  ? K 3.0 (L) 12/30/2021 1812  ? CL 104 12/31/2021 0405  ? CL 99 12/30/2021 1812  ? CO2 26 12/31/2021 0405  ? CO2 26 12/30/2021 1812  ? GLUCOSE 213 (H) 12/31/2021 0405  ? GLUCOSE 253 (H) 12/30/2021 1812  ? BUN 8 12/31/2021 0405  ? BUN 14 12/30/2021 1812  ? CREATININE 0.80 12/31/2021 0405  ? CREATININE 1.02 (H) 12/30/2021 1812  ? CALCIUM 9.0 12/31/2021 0405  ? CALCIUM 9.2 12/30/2021 1812  ? GFRNONAA >60 12/31/2021 0405  ? GFRNONAA >60 12/30/2021 1812  ? ?CBC ?   ?Component Value Date/Time  ? WBC 16.0 (H) 01/01/2022 0230   ? RBC 4.59 01/01/2022 0230  ? HGB 13.0 01/01/2022 0230  ? HCT 38.5 01/01/2022 0230  ? PLT 283 01/01/2022 0230  ? MCV 83.9 01/01/2022 0230  ? MCH 28.3 01/01/2022 0230  ? MCHC 33.8 01/01/2022 0230  ? RDW 15.3 01/01/2022 0230  ? ?HEPATIC Function Panel ?No results for input(s): PROT in the last 8760 hours. ? ?Invalid input(s):  ALBUMIN,  AST,  ALT,  ALKPHOS,  BILIDIR,  IBILI ?HEMOGLOBIN A1C ?No components found for: HGA1C,  MPG ?CARDIAC ENZYMES ?No results found for: CKTOTAL, CKMB, CKMBINDEX, TROPONINI ?BNP ?No results for input(s): PROBNP in the last 8760 hours. ?TSH ?No results for input(s): TSH in the last 8760 hours. ?CHOLESTEROL ?Recent Labs  ?  12/31/21 ?0405  ?CHOL 176  ? ? ?Scheduled Meds: ? aspirin EC  81 mg Oral Daily  ? atorvastatin  40 mg Oral Daily  ? cephALEXin  500 mg Oral Q8H  ? diltiazem  360 mg Oral Daily  ? hydrALAZINE  12.5 mg Oral BID  ? hydrochlorothiazide  12.5 mg Oral Daily  ? linagliptin  5 mg Oral Daily  ? mouth rinse  15 mL Mouth Rinse BID  ? metoprolol succinate  25 mg Oral Daily  ? ?Continuous Infusions: ? heparin 1,550 Units/hr (12/31/21 2314)  ? ?PRN Meds:.acetaminophen, nitroGLYCERIN, ondansetron (ZOFRAN) IV, traMADol ? ?Assessment/Plan: ? Acute PE ?Chest pain ?HTN ?Obesity ?Hypothyroidism ? ?Plan: ?Continue IV heparin. ?Increase metoprolol dose  to 50 mg. XL daily. ?Check potassium level. ?Check troponin I level. ?[place] legs to r/o DVT ?Discussed with patient, need to continue anticoagulation for 6 months. ? ? LOS: 1 day  ? ?Time spent including chart review, lab review, examination, discussion with patient/Family/Nurse : 30 min ? ? ?Orpah Cobb  MD  ?01/01/2022, 11:11 AM ? ? ? ? ?

## 2022-01-01 NOTE — Progress Notes (Signed)
Date and time results received: 01/01/22 1240 ?(use smartphrase ".now" to insert current time) ? ?Test: troponin ?Critical Value: 117 ? ?Name of Provider Notified:  DR [facility] ? ?Orders Received? Or Actions Taken?: NO NEW ORDERS RECIEVED ?

## 2022-01-01 NOTE — TOC Initial Note (Signed)
Transition of Care (TOC) - Initial/Assessment Note  ? ? ?Patient Details  ?Name: Victoria Petersen ?MRN: [id number] ?Date of Birth: 07-11-1961 ? ?Transition of Care (TOC) CM/SW Contact:    ?Kermit Balo, RN ?Phone Number: ?01/01/2022, 1:19 PM ? ?Clinical Narrative:             ?PCP: Dr Kristen Loader in [facility]     ?Patient is from home with her spouse. She is alone in the daytime if she is working from home but does go into the office some on the week.  ?She denies any DME at home.  ?She denies any issues with home medications or transportation.  ?She does have health insurance and CM has copied the card and sent it to admissions to get entered.  ?TOC following. ? ?Expected Discharge Plan: Home/Self Care ?Barriers to Discharge: Continued Medical Work up ? ? ?Patient Goals and CMS Choice ?  ?  ?  ? ?Expected Discharge Plan and Services ?Expected Discharge Plan: Home/Self Care ?  ?Discharge Planning Services: CM Consult ?  ?Living arrangements for the past 2 months: Single Family Home ?                ?  ?  ?  ?  ?  ?  ?  ?  ?  ?  ? ?Prior Living Arrangements/Services ?Living arrangements for the past 2 months: Single Family Home ?Lives with:: Spouse ?Patient language and need for interpreter reviewed:: Yes ?Do you feel safe going back to the place where you live?: Yes      ?  ?  ?  ?Criminal Activity/Legal Involvement Pertinent to Current Situation/Hospitalization: No - Comment as needed ? ?Activities of Daily Living ?Home Assistive Devices/Equipment: None ?ADL Screening (condition at time of admission) ?Patient's cognitive ability adequate to safely complete daily activities?: Yes ?Is the patient deaf or have difficulty hearing?: No ?Does the patient have difficulty seeing, even when wearing glasses/contacts?: No ?Does the patient have difficulty concentrating, remembering, or making decisions?: No ?Patient able to express need for assistance with ADLs?: Yes ?Does the patient have difficulty dressing or bathing?:  No ?Independently performs ADLs?: Yes (appropriate for developmental age) ?Does the patient have difficulty walking or climbing stairs?: Yes ?Weakness of Legs: None ?Weakness of Arms/Hands: None ? ?Permission Sought/Granted ?  ?  ?   ?   ?   ?   ? ?Emotional Assessment ?Appearance:: Appears stated age ?Attitude/Demeanor/Rapport: Engaged ?Affect (typically observed): Accepting ?Orientation: : Oriented to Self, Oriented to Place, Oriented to  Time, Oriented to Situation ?  ?Psych Involvement: No (comment) ? ?Admission diagnosis:  NSTEMI (non-ST elevated myocardial infarction) (HCC) [I21.4] ?Multiple subsegmental pulmonary emboli without acute cor pulmonale (HCC) [I26.94] ?Acute pulmonary embolism without acute cor pulmonale (HCC) [I26.99] ?Patient Active Problem List  ? Diagnosis Date Noted  ? Acute pulmonary embolism without acute cor pulmonale (HCC) 12/31/2021  ? NSTEMI (non-ST elevated myocardial infarction) (HCC) 12/30/2021  ? Left shoulder pain 12/15/2013  ? Pain in limb 11/14/2013  ? Tingling   ? Chest pain 08/20/2013  ? Morbid obesity (HCC) 08/20/2013  ? Allergy   ? Hypertension   ? Thyroid disease   ? ?PCP:  Laqueta Due., MD ?Pharmacy:   ?[id number] - [place] - [address] AT [facility] [address] ?[address] ?[place]  [address] ?Phone: [phone] Fax: [phone] ? ? ? ? ?Social Determinants of Health (SDOH) Interventions ?  ? ?  Readmission Risk Interventions ?No flowsheet data found. ? ? ?

## 2022-01-01 NOTE — Progress Notes (Signed)
ANTICOAGULATION CONSULT NOTE  ? ?Pharmacy Consult for heparin ?Indication: chest pain/ACS > PE ? ?Allergies  ?Allergen Reactions  ? Olmesartan   ?  Other reaction(s): Angioedema (ALLERGY/intolerance)  ? Amoxicillin-Pot Clavulanate Nausea And Vomiting  ? Lisinopril   ?  Other reaction(s): Cough (ALLERGY/intolerance)  ? Metformin And Related Rash  ? Percocet [Oxycodone-Acetaminophen] Itching  ? Tape Other (See Comments)  ?  Itching - paper tape  ? ? ?Patient Measurements: ?Height: 5\' 4"  (162.6 cm) ?Weight: 127.6 kg (281 lb 3.2 oz) ?IBW/kg (Calculated) : 54.7 ?Heparin Dosing Weight: 86.5 kg ? ?Vital Signs: ?Temp: 98.4 ?F (36.9 ?C) (03/19 0449) ?Temp Source: Oral (03/19 0449) ?BP: 141/91 (03/19 0650) ?Pulse Rate: 81 (03/19 0449) ? ?Labs: ?Recent Labs  ?  12/30/21 ?1812 12/30/21 ?2009 12/31/21 ?0405 12/31/21 ?1143 01/01/22 ?0230  ?HGB 14.4  --  13.2  --  13.0  ?HCT 43.1  --  39.4  --  38.5  ?PLT 310  --  280  --  283  ?HEPARINUNFRC  --   --  0.33 0.45 0.44  ?CREATININE 1.02*  --  0.80  --   --   ?TROPONINIHS 832* 789*  --   --   --   ? ? ? ?Estimated Creatinine Clearance: 97.8 mL/min (by C-G formula based on SCr of 0.8 mg/dL). ? ? ?Medical History: ?Past Medical History:  ?Diagnosis Date  ? Allergy   ? Hypertension   ? Neck pain   ? Numbness   ? Thyroid disease   ? Tingling   ? ? ?Medications:  ?Patient on no anticoagulation PTA.  ? ?Assessment: ?Patient presented with chest pain. Troponin is 832. Heparin for ACS initially started, now CT angio chest showing PE. Not on anticoagulation PTA. ? ?Heparin level therapeutic at 0.44 on 1550 units/hr. CBC stable with Hgb 13 and PLT 283. No s/sx of bleeding noted.  ? ?Goal of Therapy:  ?Heparin level 0.3-0.7 units/ml ?Monitor platelets by anticoagulation protocol: Yes ?  ?Plan:  ?Continue heparin infusion at 1550 units/hr ?Monitor CBC, heparin level, and s/sx of bleeding daily ?F/up plans to transition to oral anticoagulation   ? ?01/03/22, Pharm.D. ?PGY-1 Pharmacy  Resident ?[phone] ?01/01/2022 7:30 AM ? ?

## 2022-01-01 NOTE — Progress Notes (Signed)
Lower extremity venous has been completed.  ? ?Preliminary results in CV Proc.  ? ?Victoria Petersen Victoria Petersen ?01/01/2022 11:59 AM    ?

## 2022-01-01 NOTE — Progress Notes (Addendum)
Dr. Algie Coffer notified of bilateral lower extremity doppler results. ?

## 2022-01-02 ENCOUNTER — Other Ambulatory Visit (HOSPITAL_COMMUNITY): Payer: Self-pay

## 2022-01-02 LAB — GLUCOSE, CAPILLARY
Glucose-Capillary: 180 mg/dL — ABNORMAL HIGH (ref 70–99)
Glucose-Capillary: 204 mg/dL — ABNORMAL HIGH (ref 70–99)
Glucose-Capillary: 206 mg/dL — ABNORMAL HIGH (ref 70–99)
Glucose-Capillary: 207 mg/dL — ABNORMAL HIGH (ref 70–99)

## 2022-01-02 LAB — CBC
HCT: 38.9 % (ref 36.0–46.0)
Hemoglobin: 12.5 g/dL (ref 12.0–15.0)
MCH: 27.5 pg (ref 26.0–34.0)
MCHC: 32.1 g/dL (ref 30.0–36.0)
MCV: 85.5 fL (ref 80.0–100.0)
Platelets: 294 10*3/uL (ref 150–400)
RBC: 4.55 MIL/uL (ref 3.87–5.11)
RDW: 15.3 % (ref 11.5–15.5)
WBC: 14.4 10*3/uL — ABNORMAL HIGH (ref 4.0–10.5)
nRBC: 0 % (ref 0.0–0.2)

## 2022-01-02 LAB — HEPARIN LEVEL (UNFRACTIONATED): Heparin Unfractionated: 0.35 IU/mL (ref 0.30–0.70)

## 2022-01-02 MED ORDER — RIVAROXABAN 20 MG PO TABS
20.0000 mg | ORAL_TABLET | Freq: Every day | ORAL | Status: DC
Start: 2022-01-23 — End: 2022-01-03

## 2022-01-02 MED ORDER — DIPHENHYDRAMINE HCL 25 MG PO CAPS
25.0000 mg | ORAL_CAPSULE | Freq: Once | ORAL | Status: AC
  Administered 2022-01-03: 25 mg via ORAL
  Filled 2022-01-02: qty 1

## 2022-01-02 MED ORDER — POTASSIUM CHLORIDE CRYS ER 10 MEQ PO TBCR
10.0000 meq | EXTENDED_RELEASE_TABLET | Freq: Every day | ORAL | Status: DC
  Administered 2022-01-02 – 2022-01-03 (×2): 10 meq via ORAL
  Filled 2022-01-02 (×2): qty 1

## 2022-01-02 MED ORDER — RIVAROXABAN 15 MG PO TABS
15.0000 mg | ORAL_TABLET | Freq: Two times a day (BID) | ORAL | Status: DC
  Administered 2022-01-02 – 2022-01-03 (×2): 15 mg via ORAL
  Filled 2022-01-02 (×2): qty 1

## 2022-01-02 NOTE — Discharge Instructions (Addendum)
Information on my medicine - XARELTO? (rivaroxaban) ? ? ?WHY WAS XARELTO? PRESCRIBED FOR YOU? ?Xarelto? was prescribed to treat blood clots that may have been found in the veins of your legs (deep vein thrombosis) or in your lungs (pulmonary embolism) and to reduce the risk of them occurring again. ? ?What do you need to know about Xarelto?? ?The starting dose is one 15 mg tablet taken TWICE daily with food for the FIRST 21 DAYS then on 01/23/22 the dose is changed to one 20 mg tablet taken ONCE A DAY with your evening meal. ? ?DO NOT stop taking Xarelto? without talking to the health care provider who prescribed the medication.  Refill your prescription for 20 mg tablets before you run out. ? ?After discharge, you should have regular check-up appointments with your healthcare provider that is prescribing your Xarelto?Marland Kitchen  In the future your dose may need to be changed if your kidney function changes by a significant amount. ? ?What do you do if you miss a dose? ?If you are taking Xarelto? TWICE DAILY and you miss a dose, take it as soon as you remember. You may take two 15 mg tablets (total 30 mg) at the same time then resume your regularly scheduled 15 mg twice daily the next day. ? ?If you are taking Xarelto? ONCE DAILY and you miss a dose, take it as soon as you remember on the same day then continue your regularly scheduled once daily regimen the next day. Do not take two doses of Xarelto? at the same time.  ? ?Important Safety Information ?Xarelto? is a blood thinner medicine that can cause bleeding. You should call your healthcare provider right away if you experience any of the following: ?Bleeding from an injury or your nose that does not stop. ?Unusual colored urine (red or dark brown) or unusual colored stools (red or black). ?Unusual bruising for unknown reasons. ?A serious fall or if you hit your head (even if there is no bleeding). ? ?Some medicines may interact with Xarelto? and might increase your risk  of bleeding while on Xarelto?Marland Kitchen To help avoid this, consult your healthcare provider or pharmacist prior to using any new prescription or non-prescription medications, including herbals, vitamins, non-steroidal anti-inflammatory drugs (NSAIDs) and supplements. ? ?This website has more information on Xarelto?: [email or web address].br.  ? ?

## 2022-01-02 NOTE — Progress Notes (Signed)
Mobility Specialist Progress Note  ? ? 01/02/22 1644  ?Mobility  ?Activity Ambulated independently in hallway  ?Level of Assistance Independent  ?Assistive Device None  ?Distance Ambulated (ft) 450 ft  ?Activity Response Tolerated well  ?$Mobility charge 1 Mobility  ? ?During Mobility: 85 HR ?Post-Mobility: 77 HR ? ?Pt received in chair and agreeable. No complaints. Returned to chair. ? ?Hildred Alamin ?Mobility Specialist  ?  ?

## 2022-01-02 NOTE — Progress Notes (Signed)
Ref: Karleen Hampshire., MD ? ? ?Subjective:  ?Feeling better.  ?VS stable. ?Occasional chest discomfort. ? ?Objective:  ?Vital Signs in the last 24 hours: ?Temp:  [98.1 ?F (36.7 ?C)-98.6 ?F (37 ?C)] 98.3 ?F (36.8 ?C) (03/20 1418) ?Pulse Rate:  [72-75] 72 (03/20 1418) ?Cardiac Rhythm: Normal sinus rhythm;Heart block (03/20 0840) ?Resp:  [17] 17 (03/20 1418) ?BP: (122-141)/(75-90) 122/75 (03/20 1419) ?SpO2:  [92 %-96 %] 92 % (03/20 0558) ?Weight:  [128.6 kg] 128.6 kg (03/20 0558) ? ?Physical Exam: ?BP Readings from Last 1 Encounters:  ?01/02/22 122/75  ?   ?Wt Readings from Last 1 Encounters:  ?01/02/22 128.6 kg  ?  Weight change:  Body mass index is 48.68 kg/m?. ?HEENT: [place]/AT, Eyes-Brown, Conjunctiva-Pink, Sclera-Non-icteric ?Neck: No JVD, No bruit, Trachea midline. ?Lungs:  Clear, Bilateral. ?Cardiac:  Regular rhythm, normal S1 and S2, no S3. II/VI systolic murmur. ?Abdomen:  Soft, non-tender. BS present. ?Extremities:  No edema present. No cyanosis. No clubbing. ?CNS: AxOx3, Cranial nerves grossly intact, moves all 4 extremities.  ?Skin: Warm and dry. ? ? ?Intake/Output from previous day: ?03/19 0701 - 03/20 0700 ?In: 540 [P.O.:540] ?Out: 1150 [Urine:1150] ? ? ? ?Lab Results: ?BMET ?   ?Component Value Date/Time  ? NA 137 01/01/2022 1117  ? NA 140 12/31/2021 0405  ? NA 135 12/30/2021 1812  ? K 3.8 01/01/2022 1117  ? K 3.0 (L) 12/31/2021 0405  ? K 3.0 (L) 12/30/2021 1812  ? CL 103 01/01/2022 1117  ? CL 104 12/31/2021 0405  ? CL 99 12/30/2021 1812  ? CO2 26 01/01/2022 1117  ? CO2 26 12/31/2021 0405  ? CO2 26 12/30/2021 1812  ? GLUCOSE 167 (H) 01/01/2022 1117  ? GLUCOSE 213 (H) 12/31/2021 0405  ? GLUCOSE 253 (H) 12/30/2021 1812  ? BUN 15 01/01/2022 1117  ? BUN 8 12/31/2021 0405  ? BUN 14 12/30/2021 1812  ? CREATININE 0.89 01/01/2022 1117  ? CREATININE 0.80 12/31/2021 0405  ? CREATININE 1.02 (H) 12/30/2021 1812  ? CALCIUM 9.1 01/01/2022 1117  ? CALCIUM 9.0 12/31/2021 0405  ? CALCIUM 9.2 12/30/2021 1812  ? GFRNONAA >60  01/01/2022 1117  ? GFRNONAA >60 12/31/2021 0405  ? GFRNONAA >60 12/30/2021 1812  ? ?CBC ?   ?Component Value Date/Time  ? WBC 14.4 (H) 01/02/2022 0105  ? RBC 4.55 01/02/2022 0105  ? HGB 12.5 01/02/2022 0105  ? HCT 38.9 01/02/2022 0105  ? PLT 294 01/02/2022 0105  ? MCV 85.5 01/02/2022 0105  ? MCH 27.5 01/02/2022 0105  ? MCHC 32.1 01/02/2022 0105  ? RDW 15.3 01/02/2022 0105  ? ?HEPATIC Function Panel ?No results for input(s): PROT in the last 8760 hours. ? ?Invalid input(s):  ALBUMIN,  AST,  ALT,  ALKPHOS,  BILIDIR,  IBILI ?HEMOGLOBIN A1C ?No components found for: HGA1C,  MPG ?CARDIAC ENZYMES ?No results found for: CKTOTAL, CKMB, CKMBINDEX, TROPONINI ?BNP ?No results for input(s): PROBNP in the last 8760 hours. ?TSH ?No results for input(s): TSH in the last 8760 hours. ?CHOLESTEROL ?Recent Labs  ?  12/31/21 ?0405  ?CHOL 176  ? ? ?Scheduled Meds: ? aspirin EC  81 mg Oral Daily  ? atorvastatin  40 mg Oral Daily  ? cephALEXin  500 mg Oral Q8H  ? diltiazem  360 mg Oral Daily  ? hydrALAZINE  12.5 mg Oral BID  ? hydrochlorothiazide  12.5 mg Oral Daily  ? linagliptin  5 mg Oral Daily  ? mouth rinse  15 mL Mouth Rinse BID  ? metoprolol  succinate  50 mg Oral Daily  ? potassium chloride  10 mEq Oral Daily  ? Rivaroxaban  15 mg Oral BID WC  ? Followed by  ? [START ON 01/23/2022] rivaroxaban  20 mg Oral Q supper  ? ?Continuous Infusions: ? ?PRN Meds:.acetaminophen, nitroGLYCERIN, ondansetron (ZOFRAN) IV, traMADol ? ?Assessment/Plan: ? Acute PE ?Chest pain ?Left leg DVT ?HTN ?Morbid obesity ? ?Plan: ?Change IV heparin to Xarelto. ?Increase activity. ?Postpone cardiac cath for now. ? ? LOS: 2 days  ? ?Time spent including chart review, lab review, examination, discussion with patient/Family : 30 min ? ? ?Dixie Dials  MD  ?01/02/2022, 4:08 PM ? ? ? ? ?

## 2022-01-02 NOTE — TOC Benefit Eligibility Note (Signed)
Patient Advocate Encounter ? ?Prior Authorization for Xarelto 20 mg has been approved.   ? ?PA# [id number] ?Effective dates: 01/02/2022 through 01/02/2023 ? ?Patients co-pay is $45.00.  ? ? ? ?Roland Earl, CPhT ?Pharmacy Patient Advocate Specialist ?[facility] Patient Advocate Team ?Direct Number: [phone]  Fax: [phone]  ?

## 2022-01-02 NOTE — TOC Benefit Eligibility Note (Signed)
Patient Advocate Encounter ? ?Insurance verification completed.   ? ?The patient is currently admitted and upon discharge could be taking Eliquis 5 mg. ? ?Requires Prior Authorization ? ?The patient is currently admitted and upon discharge could be taking Xarelto 20 mg. ? ?Requires Prior Authorization ? ?The patient is insured through [employer]  ? ? ? ?Pearl Bents, CPhT ?Pharmacy Patient Advocate Specialist ?[facility] Patient Advocate Team ?Direct Number: [phone]  Fax: [phone] ? ? ? ? ? ?  ?

## 2022-01-02 NOTE — Progress Notes (Addendum)
ANTICOAGULATION CONSULT NOTE  ? ?Pharmacy Consult for heparin ?Indication: chest pain/ACS > PE/DVT ? ?Allergies  ?Allergen Reactions  ? Olmesartan   ?  Other reaction(s): Angioedema (ALLERGY/intolerance)  ? Amoxicillin-Pot Clavulanate Nausea And Vomiting  ? Lisinopril   ?  Other reaction(s): Cough (ALLERGY/intolerance)  ? Metformin And Related Rash  ? Percocet [Oxycodone-Acetaminophen] Itching  ? Tape Other (See Comments)  ?  Itching - paper tape  ? ? ?Patient Measurements: ?Height: 5\' 4"  (162.6 cm) ?Weight: 128.6 kg (283 lb 9.6 oz) ?IBW/kg (Calculated) : 54.7 ?Heparin Dosing Weight: 86.5 kg ? ?Vital Signs: ?Temp: 98.3 ?F (36.8 ?C) (03/20 11-25-1970) ?Temp Source: Oral (03/20 11-25-1970) ?BP: 135/89 (03/20 0558) ?Pulse Rate: 72 (03/20 0558) ? ?Labs: ?Recent Labs  ?  12/30/21 ?1812 12/30/21 ?1812 12/30/21 ?2009 12/31/21 ?0405 12/31/21 ?1143 01/01/22 ?0230 01/01/22 ?1117 01/01/22 ?1130 01/02/22 ?0105  ?HGB 14.4  --   --  13.2  --  13.0  --   --  12.5  ?HCT 43.1  --   --  39.4  --  38.5  --   --  38.9  ?PLT 310  --   --  280  --  283  --   --  294  ?HEPARINUNFRC  --    < >  --  0.33 0.45 0.44  --   --  0.35  ?CREATININE 1.02*  --   --  0.80  --   --  0.89  --   --   ?TROPONINIHS 832*  --  789*  --   --   --   --  117*  --   ? < > = values in this interval not displayed.  ? ? ? ?Estimated Creatinine Clearance: 88.3 mL/min (by C-G formula based on SCr of 0.89 mg/dL). ? ? ?Medical History: ?Past Medical History:  ?Diagnosis Date  ? Allergy   ? Hypertension   ? Neck pain   ? Numbness   ? Thyroid disease   ? Tingling   ? ? ?Medications:  ?Patient on no anticoagulation PTA.  ? ?Assessment: ?Patient presented with chest pain. Troponin is 832. Heparin for ACS initially started, now CT angio chest showing PE. Not on anticoagulation PTA. ? ?Heparin level therapeutic at 0.35 on 1550 units/hr. CBC stable with Hgb 12.5 and PLT 294. No s/sx of bleeding noted.  ? ?Dopplers show LLE DVT ? ?Goal of Therapy:  ?Heparin level 0.3-0.7  units/ml ?Monitor platelets by anticoagulation protocol: Yes ?  ?Plan:  ?Increase heparin infusion to 1650 units/hr to keep within range ?Monitor CBC, heparin level, and s/sx of bleeding daily ?F/up plans to transition to oral anticoagulation   ? ?01/04/22 PharmD., BCPS ?Clinical Pharmacist ?01/02/2022 8:23 AM ? ?

## 2022-01-03 ENCOUNTER — Other Ambulatory Visit (HOSPITAL_COMMUNITY): Payer: Self-pay

## 2022-01-03 LAB — GLUCOSE, CAPILLARY
Glucose-Capillary: 188 mg/dL — ABNORMAL HIGH (ref 70–99)
Glucose-Capillary: 199 mg/dL — ABNORMAL HIGH (ref 70–99)

## 2022-01-03 MED ORDER — ATORVASTATIN CALCIUM 40 MG PO TABS
40.0000 mg | ORAL_TABLET | Freq: Every day | ORAL | 3 refills | Status: AC
  Filled 2022-01-03: qty 30, 30d supply, fill #0

## 2022-01-03 MED ORDER — POTASSIUM CHLORIDE CRYS ER 10 MEQ PO TBCR
10.0000 meq | EXTENDED_RELEASE_TABLET | Freq: Every day | ORAL | 3 refills | Status: AC
  Filled 2022-01-03: qty 30, 30d supply, fill #0

## 2022-01-03 MED ORDER — NITROGLYCERIN 0.4 MG SL SUBL
0.4000 mg | SUBLINGUAL_TABLET | SUBLINGUAL | 1 refills | Status: AC | PRN
Start: 2022-01-03 — End: ?
  Filled 2022-01-03: qty 25, 7d supply, fill #0

## 2022-01-03 MED ORDER — RIVAROXABAN 20 MG PO TABS
20.0000 mg | ORAL_TABLET | Freq: Every day | ORAL | 3 refills | Status: AC
Start: 2022-01-23 — End: ?
  Filled 2022-01-03: qty 30, 30d supply, fill #0

## 2022-01-03 MED ORDER — METOPROLOL SUCCINATE ER 50 MG PO TB24
50.0000 mg | ORAL_TABLET | Freq: Every day | ORAL | 3 refills | Status: AC
Start: 2022-01-04 — End: ?
  Filled 2022-01-03: qty 30, 30d supply, fill #0

## 2022-01-03 MED ORDER — ASPIRIN 81 MG PO TBEC: 81.0000 mg | DELAYED_RELEASE_TABLET | Freq: Every day | ORAL | Status: AC

## 2022-01-03 MED ORDER — TRAMADOL HCL 50 MG PO TABS
50.0000 mg | ORAL_TABLET | Freq: Four times a day (QID) | ORAL | 0 refills | Status: AC | PRN
  Filled 2022-01-03: qty 20, 5d supply, fill #0

## 2022-01-03 MED ORDER — CEPHALEXIN 500 MG PO CAPS
500.0000 mg | ORAL_CAPSULE | Freq: Three times a day (TID) | ORAL | 0 refills | Status: AC
Start: 2022-01-03 — End: ?
  Filled 2022-01-03: qty 25, 9d supply, fill #0

## 2022-01-03 MED ORDER — RIVAROXABAN (XARELTO) VTE STARTER PACK (15 & 20 MG)
15.0000 mg | ORAL_TABLET | Freq: Two times a day (BID) | ORAL | 0 refills | Status: AC
Start: 2022-01-03 — End: ?
  Filled 2022-01-03: qty 51, 30d supply, fill #0

## 2022-01-03 NOTE — TOC Benefit Eligibility Note (Signed)
Patient Advocate Encounter ? ?Prior Authorization for Eliquis 5 mg has been approved.   ? ?PA# [id number] ?Effective dates: 01/03/2022 through 01/03/2023 ? ?Patients co-pay is $45.00.  ? ? ? ?Roland Earl, CPhT ?Pharmacy Patient Advocate Specialist ?[facility] Patient Advocate Team ?Direct Number: [phone]  Fax: [phone]  ?

## 2022-01-03 NOTE — TOC Progression Note (Signed)
Transition of Care (TOC) - Progression Note  ? ? ?Patient Details  ?Name: Victoria Petersen ?MRN: [id number] ?Date of Birth: 1961-06-01 ? ?Transition of Care (TOC) CM/SW Contact  ?Leone Haven, RN ?Phone Number: ?01/03/2022, 4:18 PM ? ?Clinical Narrative:    ?Patient is for dc today, she will be on xarelto, toc pharmacy filling meds for her.  NCM gave her the 10.00 copay coupon for xarelto for refills.  She has transport home today. ? ? ?Expected Discharge Plan: Home/Self Care ?Barriers to Discharge: Continued Medical Work up ? ?Expected Discharge Plan and Services ?Expected Discharge Plan: Home/Self Care ?  ?Discharge Planning Services: CM Consult ?  ?Living arrangements for the past 2 months: Single Family Home ?Expected Discharge Date: 01/03/22               ?  ?  ?  ?  ?  ?  ?  ?  ?  ?  ? ? ?Social Determinants of Health (SDOH) Interventions ?  ? ?Readmission Risk Interventions ?No flowsheet data found. ? ?

## 2022-01-03 NOTE — Discharge Summary (Signed)
Physician Discharge Summary  ?Patient ID: ?Victoria Petersen ?MRN: [id number] ?DOB/AGE: [birth year] 61 y.o. ? ?Admit date: 12/30/2021 ?Discharge date: 01/03/2022 ? ?Admission Diagnoses: ?Acute PE ?Chest pain ?HTN ?Obesity ?Hypothyroidism ? ?Discharge Diagnoses:  ?Principal Problem: ?  Acute pulmonary embolism without acute cor pulmonale (HCC) ?Active problems: ?  Chest pain ?  Left leg DVT, acute ?  Type 2 DM with hyperglycemia ?  Hypertension ?  Hyperlipidemia ?  Morbid obesity ? ?Discharged Condition: good ? ?Hospital Course: 61 years old female with PMH of HTN, morbid obesity with sedentary life-style had sudden onset of shortness of breath followed by chest pain. Her CT angio chest showed acute PE. Her leg US showed left leg DVT, acute to subacute. She responded favorably to IV heparin followed by Xarelto. She had non-specific itching ?Her echocardiogram showed HCM with mild obstruction. ?She will follow with me in 1 month to discuss additional cardiac work up and repeat CT chest in 3 months. ? ?Consults: cardiology and pulmonary/intensive care ? ?Significant Diagnostic Studies: labs: Troponin I peaked at 832 ng. CBC was near normal except elevated WBC count. ?BMET was near normal except hyperglycemia. ? ?EKG: SR + RBBB + LAHB ? ?CXR: unremarkable. ?CT angio chest: Acute PE. ?Echocardiogram: Moderate to severe LVH with normal LV systolic function. ? ?Treatments: cardiac meds: metoprolol, diltiazem, Eliquis and anticoagulation: ASA ? ?Discharge Exam: ?Blood pressure 117/86, pulse 76, temperature 98.6 ?F (37 ?C), temperature source Oral, resp. rate 18, height 5\' 4"  (1.626 m), weight 128.2 kg, SpO2 98 %. ?General appearance: alert, cooperative and appears stated age. ?Head: Normocephalic, atraumatic. ?Eyes: Brown eyes, pink conjunctiva, corneas clear. PERRL, EOM's intact.  ?Neck: No adenopathy, no carotid bruit, no JVD, supple, symmetrical, trachea midline and thyroid not enlarged. ?Resp: Clear to auscultation  bilaterally. ?Cardio: Regular rate and rhythm, S1, S2 normal, II/VI systolic murmur, no click, rub or gallop. ?GI: Soft, non-tender; bowel sounds normal; no organomegaly. ?Extremities: No edema, cyanosis or clubbing. ?Skin: Warm and dry.  ?Neurologic: Alert and oriented X 3, normal strength and tone. Normal coordination and gait. ? ?Disposition: Discharge disposition: 01-Home or Self Care ? ? ? ? ? ? ? ?Allergies as of 01/03/2022   ? ?   Reactions  ? Olmesartan   ? Other reaction(s): Angioedema (ALLERGY/intolerance)  ? Amoxicillin-pot Clavulanate Nausea And Vomiting  ? Lisinopril   ? Other reaction(s): Cough (ALLERGY/intolerance)  ? Metformin And Related Rash  ? Percocet [oxycodone-acetaminophen] Itching  ? Tape Other (See Comments)  ? Itching - paper tape  ? ?  ? ?  ?Medication List  ?  ? ?STOP taking these medications   ? ?celecoxib 100 MG capsule ?Commonly known as: CeleBREX ?  ?Vitamin D (Ergocalciferol) 1.25 MG (50000 UNIT) Caps capsule ?Commonly known as: DRISDOL ?  ? ?  ? ?TAKE these medications   ? ?acetaminophen 500 MG tablet ?Commonly known as: TYLENOL ?Take 1,000 mg by mouth every 6 (six) hours as needed for moderate pain. ?  ?ALEVE PO ?Take 1 tablet by mouth daily as needed (pain). ?  ?aspirin 81 MG EC tablet ?Take 1 tablet (81 mg total) by mouth daily. Swallow whole. ?Start taking on: January 04, 2022 ?  ?atorvastatin 40 MG tablet ?Commonly known as: LIPITOR ?Take 1 tablet (40 mg total) by mouth daily. ?Start taking on: January 04, 2022 ?  ?bupivacaine 0.25 % injection ?Commonly known as: MARCAINE ?Inject 5 mLs into the articular space once. ?  ?cephALEXin 500 MG capsule ?Commonly known as: KEFLEX ?Take 1  capsule (500 mg total) by mouth every 8 (eight) hours. ?  ?diltiazem 360 MG 24 hr capsule ?Commonly known as: CARDIZEM CD ?Take 360 mg by mouth daily. ?  ?hydrALAZINE 25 MG tablet ?Commonly known as: APRESOLINE ?Take 12.5 mg by mouth 2 (two) times daily. ?  ?hydrochlorothiazide 12.5 MG tablet ?Commonly  known as: HYDRODIURIL ?Take 12.5 mg by mouth daily. ?  ?Januvia 100 MG tablet ?Generic drug: sitaGLIPtin ?Take 100 mg by mouth daily. ?  ?magnesium gluconate 500 MG tablet ?Commonly known as: MAGONATE ?Take 500 mg by mouth 2 (two) times daily. ?  ?metoprolol succinate 50 MG 24 hr tablet ?Commonly known as: TOPROL-XL ?Take 1 tablet (50 mg total) by mouth daily. Take with or immediately following a meal. ?Start taking on: January 04, 2022 ?  ?nitroGLYCERIN 0.4 MG SL tablet ?Commonly known as: NITROSTAT ?Place 1 tablet (0.4 mg total) under the tongue every 5 (five) minutes x 3 doses as needed for chest pain. ?  ?potassium chloride 10 MEQ tablet ?Commonly known as: KLOR-CON M ?Take 1 tablet (10 mEq total) by mouth daily. ?Start taking on: January 04, 2022 ?  ?Rivaroxaban 15 MG Tabs tablet ?Commonly known as: XARELTO ?Take 1 tablet (15 mg total) by mouth 2 (two) times daily with a meal. ?  ?rivaroxaban 20 MG Tabs tablet ?Commonly known as: XARELTO ?Take 1 tablet (20 mg total) by mouth daily with supper. ?Start taking on: January 23, 2022 ?  ?traMADol 50 MG tablet ?Commonly known as: ULTRAM ?Take 1 tablet (50 mg total) by mouth every 6 (six) hours as needed for moderate pain. ?  ? ?  ? ? Follow-up Information   ? ? Laqueta Due., MD Follow up in 1 week(s).   ?Specialty: Internal Medicine ?Contact information: ?[address] ?[address] ?[place] [address] ?[phone] ? ? ?  ?  ? ? Orpah Cobb, MD Follow up in 1 month(s).   ?Specialty: Cardiology ?Contact information: ?[address] ?[address] ?[place] [address] ?[phone] ? ? ?  ?  ? ?  ?  ? ?  ? ? ?Time spent: Review of old chart, current chart, lab, x-ray, cardiac tests and discussion with patient over 60 minutes. ? ?Signed: ?Ricki Rodriguez ?01/03/2022, 3:05 PM ? ? ?

## 2022-01-11 ENCOUNTER — Telehealth (HOSPITAL_COMMUNITY): Payer: Self-pay

## 2022-01-11 NOTE — Telephone Encounter (Signed)
Transitions of Care Pharmacy  ° °Call attempted for a pharmacy transitions of care follow-up. HIPAA appropriate voicemail was left with call back information provided.  ° °Call attempt #1. Will follow-up in 2-3 days.  °  °

## 2022-01-16 ENCOUNTER — Other Ambulatory Visit (HOSPITAL_COMMUNITY): Payer: Self-pay

## 2022-01-19 ENCOUNTER — Other Ambulatory Visit (HOSPITAL_COMMUNITY): Payer: Self-pay

## 2022-01-19 ENCOUNTER — Telehealth (HOSPITAL_COMMUNITY): Payer: Self-pay

## 2022-01-19 NOTE — Telephone Encounter (Signed)
Call #2 attempted for [facility] pharmacy follow-up. Left HIPAA appropriate VM. Will follow-up in 2-3 days. ?

## 2022-01-20 ENCOUNTER — Telehealth (HOSPITAL_COMMUNITY): Payer: Self-pay

## 2022-01-20 NOTE — Telephone Encounter (Signed)
Transitions of Care Pharmacy   Call attempted for a pharmacy transitions of care follow-up. HIPAA appropriate voicemail was left with call back information provided.   Call attempt #3. Will no longer attempt follow up for TOC pharmacy.   

## 2022-08-04 ENCOUNTER — Other Ambulatory Visit (HOSPITAL_COMMUNITY): Payer: Self-pay | Admitting: Cardiovascular Disease

## 2022-08-04 DIAGNOSIS — I2699 Other pulmonary embolism without acute cor pulmonale: Secondary | ICD-10-CM

## 2022-08-09 ENCOUNTER — Ambulatory Visit (HOSPITAL_COMMUNITY)
Admission: RE | Admit: 2022-08-09 | Discharge: 2022-08-09 | Disposition: A | Discharge status: Home / Self Care | Payer: Managed Care, Other (non HMO) | Source: Ambulatory Visit | Attending: Cardiovascular Disease | Admitting: Cardiovascular Disease

## 2022-08-09 DIAGNOSIS — Z86718 Personal history of other venous thrombosis and embolism: Secondary | ICD-10-CM

## 2022-08-09 DIAGNOSIS — I2699 Other pulmonary embolism without acute cor pulmonale: Secondary | ICD-10-CM | POA: Diagnosis present

## 2023-08-02 IMAGING — DX DG CHEST 2V
2 series · 2 of 2 positions shown · non-contrast
Comparison: 04/30/2020

CLINICAL DATA: Chest pain.

EXAM:
CHEST - 2 VIEW

[chest pa]
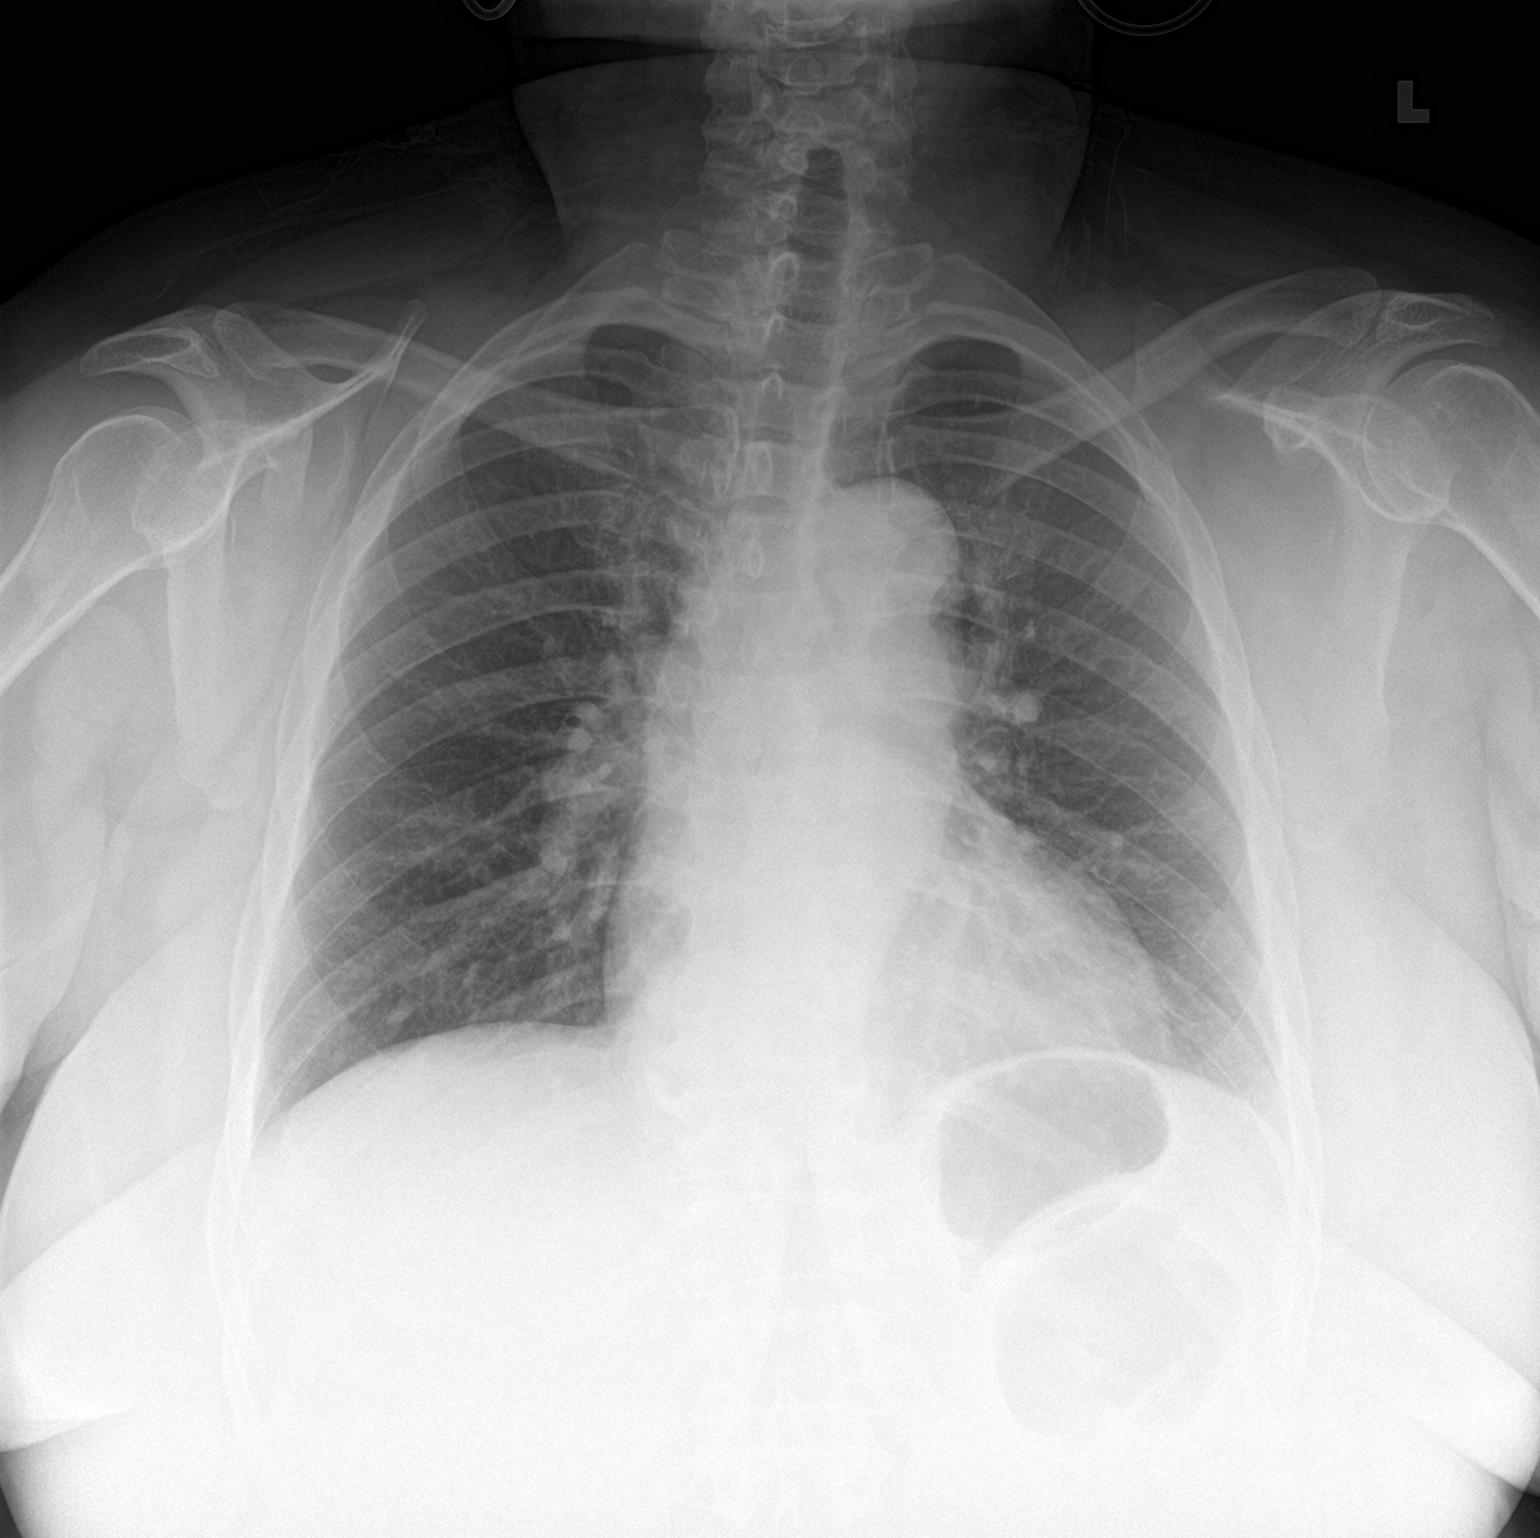

[chest lat]
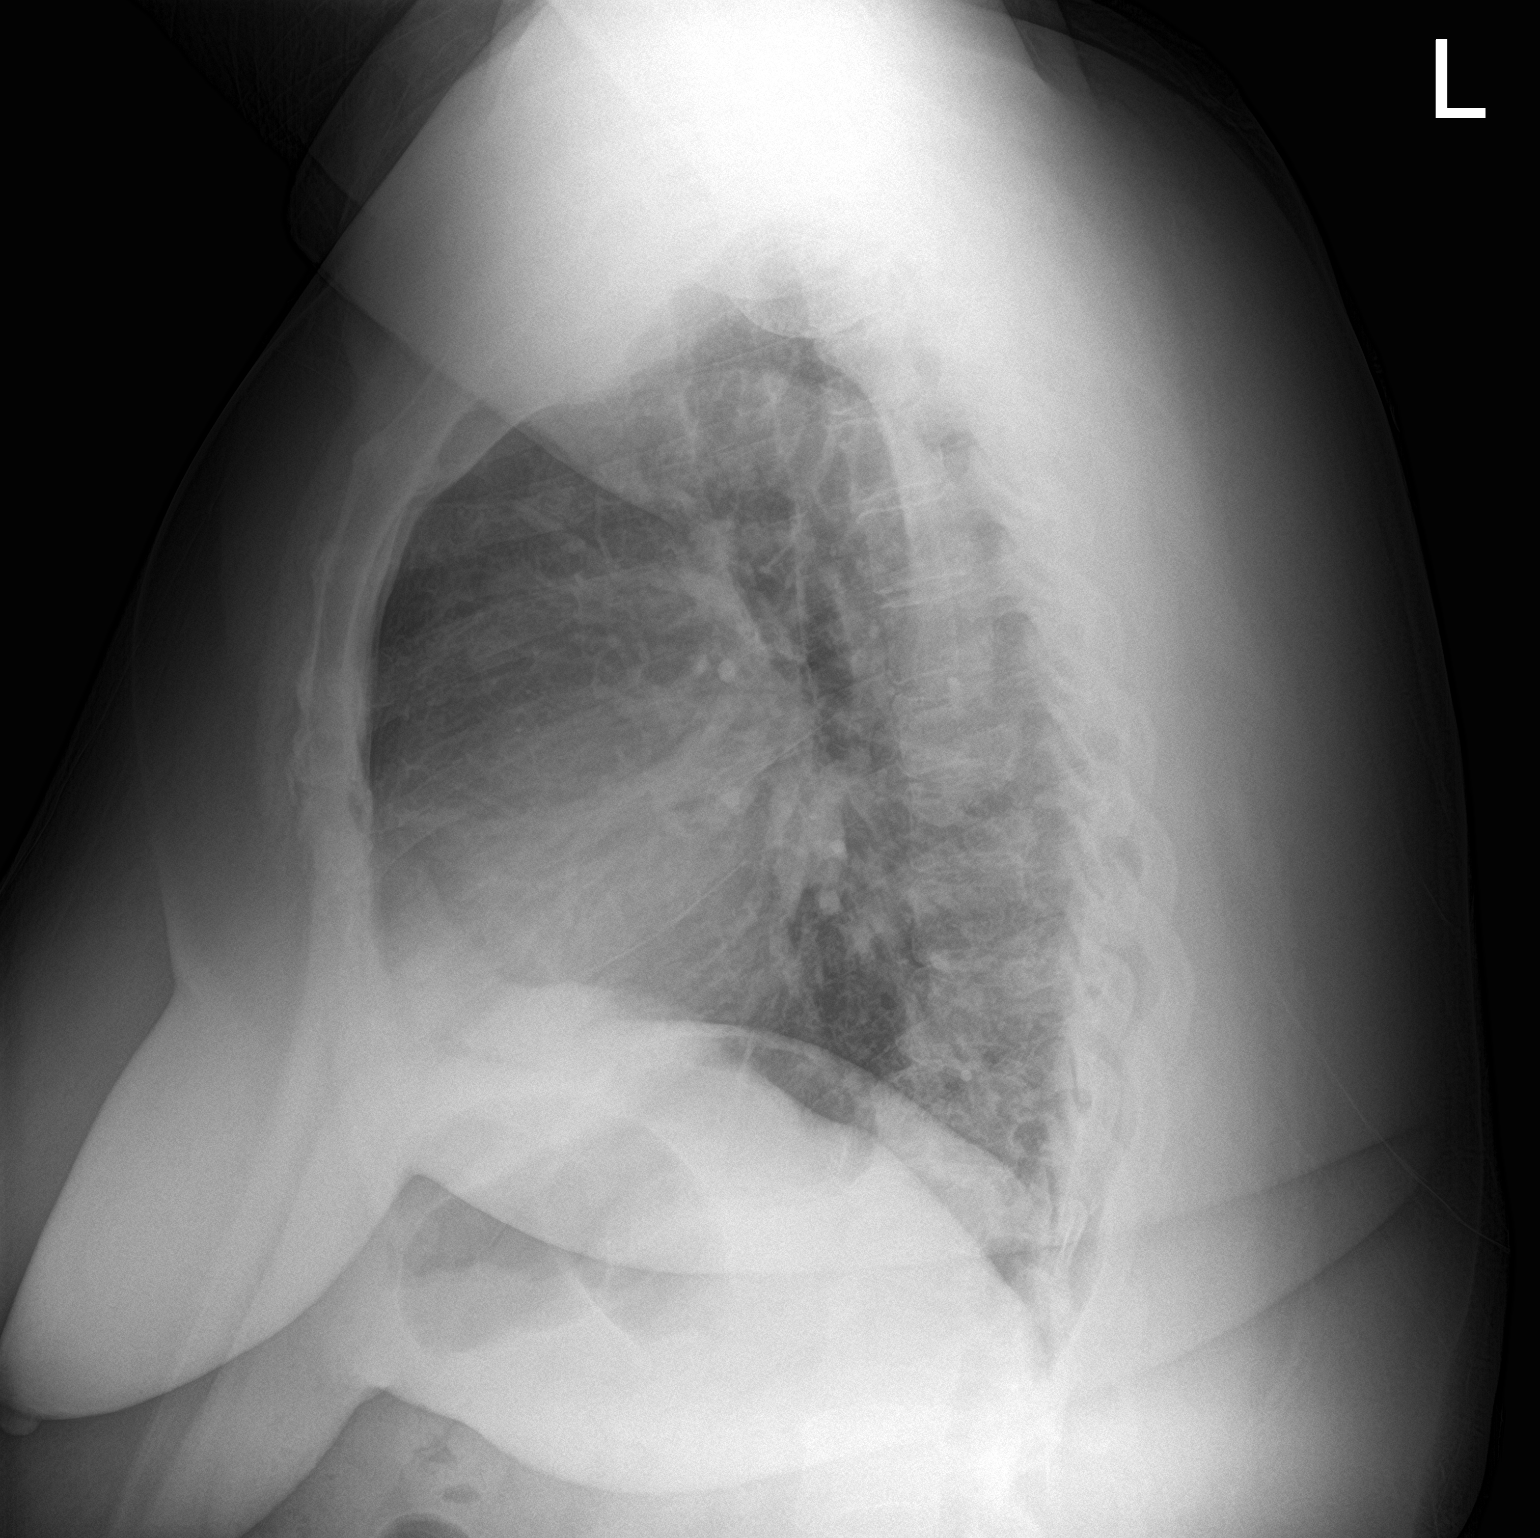

[2 of 2 positions shown; findings below may reference images not displayed]

FINDINGS: Normal heart size. Stable mediastinal contours. The lungs are clear.
Pulmonary vasculature is normal. No consolidation, pleural effusion,
or pneumothorax. No acute osseous abnormalities are seen.
IMPRESSION: No acute chest findings.

## 2023-11-19 ENCOUNTER — Other Ambulatory Visit: Payer: Self-pay

## 2023-11-19 ENCOUNTER — Emergency Department (HOSPITAL_BASED_OUTPATIENT_CLINIC_OR_DEPARTMENT_OTHER): Payer: Managed Care, Other (non HMO)

## 2023-11-19 ENCOUNTER — Encounter (HOSPITAL_BASED_OUTPATIENT_CLINIC_OR_DEPARTMENT_OTHER): Payer: Self-pay | Admitting: Urology

## 2023-11-19 ENCOUNTER — Emergency Department (HOSPITAL_BASED_OUTPATIENT_CLINIC_OR_DEPARTMENT_OTHER)
Admission: EM | Admit: 2023-11-19 | Discharge: 2023-11-19 | Disposition: A | Discharge status: Home / Self Care | Payer: Managed Care, Other (non HMO) | Attending: Emergency Medicine | Admitting: Emergency Medicine

## 2023-11-19 DIAGNOSIS — M79661 Pain in right lower leg: Secondary | ICD-10-CM | POA: Diagnosis present

## 2023-11-19 DIAGNOSIS — Z7901 Long term (current) use of anticoagulants: Secondary | ICD-10-CM | POA: Insufficient documentation

## 2023-11-19 DIAGNOSIS — Z79899 Other long term (current) drug therapy: Secondary | ICD-10-CM | POA: Diagnosis not present

## 2023-11-19 DIAGNOSIS — Z7982 Long term (current) use of aspirin: Secondary | ICD-10-CM | POA: Diagnosis not present

## 2023-11-19 NOTE — ED Notes (Signed)
D/c paperwork reviewed with pt, including follow up care.  All questions and/or concerns addressed at time of d/c.  No further needs expressed. . Pt verbalized understanding, Wheeled by ED staff to ED exit, NAD.   

## 2023-11-19 NOTE — ED Triage Notes (Signed)
Pt states woke up with pain in right calf Took asa pta   H/o dvt in left leg 3 years ago

## 2023-11-19 NOTE — Discharge Instructions (Signed)
Please read and follow all provided instructions.  Your diagnoses today include:  1. Right calf pain     Tests performed today include: Ultrasound of the right leg: Did not show signs of blood clot in the right leg Vital signs. See below for your results today.   Medications prescribed:  Please use over-the-counter NSAID medications (ibuprofen, naproxen) or Tylenol (acetaminophen) as directed on the packaging for pain -- as long as you do not have any reasons avoid these medications. Reasons to avoid NSAID medications include: weak kidneys, a history of bleeding in your stomach or gut, or uncontrolled high blood pressure or previous heart attack. Reasons to avoid Tylenol include: liver problems or ongoing alcohol use. Never take more than 4000mg  or 8 Extra strength Tylenol in a 24 hour period.     Take any prescribed medications only as directed.  Home care instructions:  Follow any educational materials contained in this packet Follow R.I.C.E. Protocol: R - rest your injury  I  - use ice on injury without applying directly to skin C - compress injury with bandage or splint E - elevate the injury as much as possible  Follow-up instructions: Please follow-up with your primary care provider if you continue to have significant pain in 1 week. In this case you may have a more severe injury that requires further care.   Return instructions:  Please return if your toes or feet are numb or tingling, appear gray or blue, or you have severe pain (also elevate the leg and loosen splint or wrap if you were given one) Please return to the Emergency Department if you experience worsening symptoms.  Please return if you have any other emergent concerns.  Additional Information:  Your vital signs today were: BP 139/76 (BP Location: Left Arm)   Pulse 77   Temp 98.2 F (36.8 C) (Oral)   Resp 18   Ht 5\' 4"  (1.626 m)   Wt 128 kg   SpO2 90%   BMI 48.44 kg/m  If your blood pressure (BP) was  elevated above 135/85 this visit, please have this repeated by your doctor within one month. --------------

## 2023-11-19 NOTE — ED Provider Notes (Signed)
[place] [facility] Provider Note   CSN: [id number] Arrival date & time: 11/19/23  7829     History  Chief Complaint  Patient presents with   Calf Pain     Victoria Petersen is a 63 y.o. female.  Patient with history of left lower extremity DVT and PE, previously on anticoagulation but states that she was taken off about a year ago --presents to the emergency department for evaluation of right calf pain.  She awoke with pain this morning.  No significant swelling.  No chest pain or shortness of breath.  No recent immobilizations or long travel.  No recent surgeries.  No falls or injuries.      Home Medications Prior to Admission medications   Medication Sig Start Date End Date Taking? Authorizing Provider  acetaminophen (TYLENOL) 500 MG tablet Take 1,000 mg by mouth every 6 (six) hours as needed for moderate pain.    [provider]  aspirin EC 81 MG EC tablet Take 1 tablet (81 mg total) by mouth daily. Swallow whole. 01/04/22   Orpah Cobb, MD  atorvastatin (LIPITOR) 40 MG tablet Take 1 tablet (40 mg total) by mouth daily. 01/04/22   Orpah Cobb, MD  bupivacaine (MARCAINE) 0.25 % injection Inject 5 mLs into the articular space once. 12/20/21   [provider]  cephALEXin (KEFLEX) 500 MG capsule Take 1 capsule (500 mg total) by mouth every 8 (eight) hours. 01/03/22   Orpah Cobb, MD  diltiazem (CARDIZEM CD) 360 MG 24 hr capsule Take 360 mg by mouth daily. 12/22/21   [provider]  hydrALAZINE (APRESOLINE) 25 MG tablet Take 12.5 mg by mouth 2 (two) times daily. 12/02/21   [provider]  hydrochlorothiazide (HYDRODIURIL) 12.5 MG tablet Take 12.5 mg by mouth daily. 12/23/21   [provider]  JANUVIA 100 MG tablet Take 100 mg by mouth daily. 12/02/21   [provider]  magnesium gluconate (MAGONATE) 500 MG tablet Take 500 mg by mouth 2 (two) times daily.    [provider]  metoprolol  succinate (TOPROL-XL) 50 MG 24 hr tablet Take 1 tablet (50 mg total) by mouth daily. Take with or immediately following a meal. 01/04/22   Orpah Cobb, MD  Naproxen Sodium (ALEVE PO) Take 1 tablet by mouth daily as needed (pain).    [provider]  nitroGLYCERIN (NITROSTAT) 0.4 MG SL tablet Place 1 tablet (0.4 mg total) under the tongue every 5 (five) minutes x 3 doses as needed for chest pain. 01/03/22   Orpah Cobb, MD  potassium chloride (KLOR-CON M) 10 MEQ tablet Take 1 tablet (10 mEq total) by mouth daily. 01/04/22   Orpah Cobb, MD  RIVAROXABAN Carlena Hurl) VTE STARTER PACK (15 & 20 MG) Take one 15 mg tablet by mouth 2 (two) times daily with a meal for 21 days, then take one 20mg  tab daily with supper. (Follow instructions on pack.) 01/03/22   Orpah Cobb, MD  rivaroxaban (XARELTO) 20 MG TABS tablet Take 1 tablet (20 mg total) by mouth daily with supper. (take after starter pack is completed) 01/23/22   Orpah Cobb, MD  traMADol (ULTRAM) 50 MG tablet Take 1 tablet (50 mg total) by mouth every 6 (six) hours as needed for moderate pain. 01/03/22   Orpah Cobb, MD      Allergies    Olmesartan, Amoxicillin-pot clavulanate, Lisinopril, Metformin and related, Percocet [oxycodone-acetaminophen], and Tape    Review of Systems   Review of Systems  Physical Exam Updated Vital Signs BP 139/76 (BP Location: Left Arm)   Pulse 77   Temp 98.2 F (36.8 C) (Oral)   Resp 18   Ht 5\' 4"  (1.626 m)   Wt 128 kg   SpO2 90%   BMI 48.44 kg/m  Physical Exam Vitals and nursing note reviewed.  Constitutional:      Appearance: She is well-developed.  HENT:     Head: Normocephalic and atraumatic.  Eyes:     Conjunctiva/sclera: Conjunctivae normal.  Cardiovascular:     Rate and Rhythm: Normal rate and regular rhythm.  Pulmonary:     Effort: No respiratory distress.  Musculoskeletal:     Cervical back: Normal range of motion and neck supple.     Comments: Mild right calf tenderness to  palpation, no gross edema.  Skin:    General: Skin is warm and dry.  Neurological:     Mental Status: She is alert.    ED Results / Procedures / Treatments   Labs (all labs ordered are listed, but only abnormal results are displayed) Labs Reviewed - No data to display  EKG None  Radiology US Venous Img Lower Unilateral Right (DVT) Result Date: 11/19/2023 CLINICAL DATA:  Right calf pain. EXAM: RIGHT LOWER EXTREMITY VENOUS DOPPLER ULTRASOUND TECHNIQUE: Gray-scale sonography with compression, as well as color and duplex ultrasound, were performed to evaluate the deep venous system(s) from the level of the common femoral vein through the popliteal and proximal calf veins. COMPARISON:  None Available. FINDINGS: VENOUS Normal compressibility of the common femoral, superficial femoral, and popliteal veins, as well as the visualized calf veins. Visualized portions of profunda femoral vein and great saphenous vein unremarkable. No filling defects to suggest DVT on grayscale or color Doppler imaging. Doppler waveforms show normal direction of venous flow, normal respiratory plasticity and response to augmentation. Limited views of the contralateral common femoral vein are unremarkable. OTHER None. Limitations: none IMPRESSION: Negative. Electronically Signed   By: Kennith Center M.D.   On: 11/19/2023 10:58    Procedures Procedures    Medications Ordered in ED Medications - No data to display  ED Course/ Medical Decision Making/ A&P    Patient seen and examined. History obtained directly from patient.   Labs/EKG: None ordered  Imaging: Ordered right lower extremity DVT study, performed, awaiting results.  Medications/Fluids: None ordered  Most recent vital signs reviewed and are as follows: BP 139/76 (BP Location: Left Arm)   Pulse 77   Temp 98.2 F (36.8 C) (Oral)   Resp 18   Ht 5\' 4"  (1.626 m)   Wt 128 kg   SpO2 90%   BMI 48.44 kg/m   Initial impression: Calf pain, rule out DVT  on the right  11:06 AM Reassessment performed. Patient appears stable.  Reassured by results.  Imaging results reviewed including: DVT study, negative  Reviewed pertinent lab work and imaging with patient at bedside. Questions answered.   Most current vital signs reviewed and are as follows: BP 139/76 (BP Location: Left Arm)   Pulse 77   Temp 98.2 F (36.8 C) (Oral)   Resp 18   Ht 5\' 4"  (1.626 m)   Wt 128 kg   SpO2 90%   BMI 48.44 kg/m   Plan: Discharge to home.   Prescriptions written for: None  Other home care instructions discussed: Rest, ice, elevation, compression over-the-counter medications as able to take.  ED return instructions discussed: Worsening pain, chest pain or shortness of breath  Follow-up  instructions discussed: Patient encouraged to follow-up with their PCP in 7 days if not improving.                                Medical Decision Making  Patient with calf pain, history of DVT.  She was concerned about recurrent DVT.  Lower extremity Doppler is negative for DVT.  Clinically suspect low likelihood of DVT.  No symptoms of PE today.  Patient looks well, nontoxic.  No distress.        Final Clinical Impression(s) / ED Diagnoses Final diagnoses:  Right calf pain    Rx / DC Orders ED Discharge Orders     None         Renne Crigler, PA-C 11/19/23 1109    Virgina Norfolk, DO 11/19/23 1133

## 2023-12-25 ENCOUNTER — Emergency Department (HOSPITAL_BASED_OUTPATIENT_CLINIC_OR_DEPARTMENT_OTHER)

## 2023-12-25 ENCOUNTER — Emergency Department (HOSPITAL_BASED_OUTPATIENT_CLINIC_OR_DEPARTMENT_OTHER)
Admission: EM | Admit: 2023-12-25 | Discharge: 2023-12-25 | Disposition: A | Discharge status: Home / Self Care | Attending: Emergency Medicine | Admitting: Emergency Medicine

## 2023-12-25 ENCOUNTER — Encounter (HOSPITAL_BASED_OUTPATIENT_CLINIC_OR_DEPARTMENT_OTHER): Payer: Self-pay | Admitting: Urology

## 2023-12-25 ENCOUNTER — Other Ambulatory Visit: Payer: Self-pay

## 2023-12-25 DIAGNOSIS — Z7901 Long term (current) use of anticoagulants: Secondary | ICD-10-CM | POA: Insufficient documentation

## 2023-12-25 DIAGNOSIS — I10 Essential (primary) hypertension: Secondary | ICD-10-CM | POA: Diagnosis not present

## 2023-12-25 DIAGNOSIS — R0789 Other chest pain: Secondary | ICD-10-CM | POA: Insufficient documentation

## 2023-12-25 DIAGNOSIS — Z7982 Long term (current) use of aspirin: Secondary | ICD-10-CM | POA: Insufficient documentation

## 2023-12-25 DIAGNOSIS — Z79899 Other long term (current) drug therapy: Secondary | ICD-10-CM | POA: Insufficient documentation

## 2023-12-25 LAB — CBC
HCT: 42.1 % (ref 36.0–46.0)
Hemoglobin: 14.1 g/dL (ref 12.0–15.0)
MCH: 28.1 pg (ref 26.0–34.0)
MCHC: 33.5 g/dL (ref 30.0–36.0)
MCV: 83.9 fL (ref 80.0–100.0)
Platelets: 335 10*3/uL (ref 150–400)
RBC: 5.02 MIL/uL (ref 3.87–5.11)
RDW: 15.3 % (ref 11.5–15.5)
WBC: 9.5 10*3/uL (ref 4.0–10.5)
nRBC: 0 % (ref 0.0–0.2)

## 2023-12-25 LAB — BASIC METABOLIC PANEL
Anion gap: 8 (ref 5–15)
BUN: 19 mg/dL (ref 8–23)
CO2: 24 mmol/L (ref 22–32)
Calcium: 9.4 mg/dL (ref 8.9–10.3)
Chloride: 109 mmol/L (ref 98–111)
Creatinine, Ser: 1.03 mg/dL — ABNORMAL HIGH (ref 0.44–1.00)
GFR, Estimated: >60 mL/min (ref >60)
Glucose, Bld: 110 mg/dL — ABNORMAL HIGH (ref 70–99)
Potassium: 3.5 mmol/L (ref 3.5–5.1)
Sodium: 141 mmol/L (ref 135–145)

## 2023-12-25 LAB — D-DIMER, QUANTITATIVE: D-Dimer, Quant: 0.79 ug{FEU}/mL — ABNORMAL HIGH (ref 0.00–0.50)

## 2023-12-25 LAB — TROPONIN I (HIGH SENSITIVITY)
Troponin I (High Sensitivity): 5 ng/L (ref <18)
Troponin I (High Sensitivity): 4 ng/L (ref <18)

## 2023-12-25 MED ORDER — LIDOCAINE VISCOUS HCL 2 % MT SOLN
15.0000 mL | Freq: Once | OROMUCOSAL | Status: AC
  Administered 2023-12-25: 15 mL via ORAL
  Filled 2023-12-25: qty 15

## 2023-12-25 MED ORDER — ALUM & MAG HYDROXIDE-SIMETH 200-200-20 MG/5ML PO SUSP
30.0000 mL | Freq: Once | ORAL | Status: AC
  Administered 2023-12-25: 30 mL via ORAL
  Filled 2023-12-25: qty 30

## 2023-12-25 MED ORDER — IOHEXOL 350 MG/ML SOLN
100.0000 mL | Freq: Once | INTRAVENOUS | Status: AC | PRN
  Administered 2023-12-25: 80 mL via INTRAVENOUS

## 2023-12-25 MED ORDER — LIDOCAINE 5 % EX PTCH: 1.0000 | MEDICATED_PATCH | CUTANEOUS | 0 refills | Status: AC

## 2023-12-25 MED ORDER — KETOROLAC TROMETHAMINE 15 MG/ML IJ SOLN
15.0000 mg | Freq: Once | INTRAMUSCULAR | Status: AC
  Administered 2023-12-25: 15 mg via INTRAVENOUS
  Filled 2023-12-25: qty 1

## 2023-12-25 NOTE — ED Notes (Signed)
 Reviewed discharge instructions, follow up and medications with pt. Questions answered and Pt states understanding. Ambulatory at discharge

## 2023-12-25 NOTE — ED Triage Notes (Signed)
 Pt states chest pressure that radiates to left arm down to elbow since Sunday  States felt like "something pulled" in chest while in bed Sunday night  Denies SOB or N/V   H/o PE

## 2023-12-25 NOTE — ED Provider Notes (Signed)
 [place] [facility] Provider Note   CSN: [id number] Arrival date & time: 12/25/23  1031     History  No chief complaint on file.   Victoria Petersen is a 63 y.o. female past medical history significant for hypertension, NSTEMI, and PE presents today for chest pressure that radiates down the left arm since Sunday.  Patient states that she feels like she "pulled something" while in bed Sunday night. Patient denies shortness of breath, nausea, vomiting, weakness, numbness, injury, current blood thinner use.  Patient also endorses feeling like her right foot is more swollen than her left.  HPI     Home Medications Prior to Admission medications   Medication Sig Start Date End Date Taking? Authorizing Provider  lidocaine (LIDODERM) 5 % Place 1 patch onto the skin daily. Remove & Discard patch within 12 hours or as directed by MD 12/25/23  Yes Dolphus Jenny, PA-C  acetaminophen (TYLENOL) 500 MG tablet Take 1,000 mg by mouth every 6 (six) hours as needed for moderate pain.    [provider]  aspirin EC 81 MG EC tablet Take 1 tablet (81 mg total) by mouth daily. Swallow whole. 01/04/22   Orpah Cobb, MD  atorvastatin (LIPITOR) 40 MG tablet Take 1 tablet (40 mg total) by mouth daily. 01/04/22   Orpah Cobb, MD  bupivacaine (MARCAINE) 0.25 % injection Inject 5 mLs into the articular space once. 12/20/21   [provider]  cephALEXin (KEFLEX) 500 MG capsule Take 1 capsule (500 mg total) by mouth every 8 (eight) hours. 01/03/22   Orpah Cobb, MD  diltiazem (CARDIZEM CD) 360 MG 24 hr capsule Take 360 mg by mouth daily. 12/22/21   [provider]  hydrALAZINE (APRESOLINE) 25 MG tablet Take 12.5 mg by mouth 2 (two) times daily. 12/02/21   [provider]  hydrochlorothiazide (HYDRODIURIL) 12.5 MG tablet Take 12.5 mg by mouth daily. 12/23/21   [provider]  JANUVIA 100 MG tablet Take 100 mg by mouth daily. 12/02/21    [provider]  magnesium gluconate (MAGONATE) 500 MG tablet Take 500 mg by mouth 2 (two) times daily.    [provider]  metoprolol succinate (TOPROL-XL) 50 MG 24 hr tablet Take 1 tablet (50 mg total) by mouth daily. Take with or immediately following a meal. 01/04/22   Orpah Cobb, MD  Naproxen Sodium (ALEVE PO) Take 1 tablet by mouth daily as needed (pain).    [provider]  nitroGLYCERIN (NITROSTAT) 0.4 MG SL tablet Place 1 tablet (0.4 mg total) under the tongue every 5 (five) minutes x 3 doses as needed for chest pain. 01/03/22   Orpah Cobb, MD  potassium chloride (KLOR-CON M) 10 MEQ tablet Take 1 tablet (10 mEq total) by mouth daily. 01/04/22   Orpah Cobb, MD  RIVAROXABAN Carlena Hurl) VTE STARTER PACK (15 & 20 MG) Take one 15 mg tablet by mouth 2 (two) times daily with a meal for 21 days, then take one 20mg  tab daily with supper. (Follow instructions on pack.) 01/03/22   Orpah Cobb, MD  rivaroxaban (XARELTO) 20 MG TABS tablet Take 1 tablet (20 mg total) by mouth daily with supper. (take after starter pack is completed) 01/23/22   Orpah Cobb, MD  traMADol (ULTRAM) 50 MG tablet Take 1 tablet (50 mg total) by mouth every 6 (six) hours as needed for moderate pain. 01/03/22   Orpah Cobb, MD      Allergies    Olmesartan, Amoxicillin-pot  clavulanate, Lisinopril, Metformin and related, Percocet [oxycodone-acetaminophen], and Tape    Review of Systems   Review of Systems  Respiratory:  Positive for chest tightness.   Cardiovascular:  Positive for chest pain and leg swelling.    Physical Exam Updated Vital Signs BP (!) 152/94   Pulse 67   Temp 98.1 F (36.7 C) (Oral)   Resp 20   Ht 5\' 4"  (1.626 m)   Wt 128 kg   SpO2 99%   BMI 48.44 kg/m  Physical Exam Vitals and nursing note reviewed.  Constitutional:      General: She is not in acute distress.    Appearance: Normal appearance. She is well-developed. She is obese. She is not ill-appearing,  toxic-appearing or diaphoretic.  HENT:     Head: Normocephalic and atraumatic.     Right Ear: External ear normal.     Left Ear: External ear normal.     Nose: Nose normal.     Mouth/Throat:     Mouth: Mucous membranes are moist.     Pharynx: Oropharynx is clear.  Eyes:     Conjunctiva/sclera: Conjunctivae normal.  Cardiovascular:     Rate and Rhythm: Normal rate and regular rhythm.     Pulses: Normal pulses.     Heart sounds: Normal heart sounds. No murmur heard. Pulmonary:     Effort: Pulmonary effort is normal. No tachypnea or respiratory distress.     Breath sounds: Normal breath sounds.  Abdominal:     Palpations: Abdomen is soft.     Tenderness: There is no abdominal tenderness.  Musculoskeletal:        General: No swelling.     Cervical back: Normal range of motion and neck supple.     Comments: Complaint lateral knee lower extremity swelling without pitting.  Equal bilateral +2 dorsalis pedis pulses.  Negative Homans' sign.  No tenderness to palpation of the patient spine.  Equal bilateral grip strength.  No deformities or step-offs noted.  Skin:    General: Skin is warm and dry.     Capillary Refill: Capillary refill takes less than 2 seconds.  Neurological:     General: No focal deficit present.     Mental Status: She is alert.  Psychiatric:        Mood and Affect: Mood normal.     ED Results / Procedures / Treatments   Labs (all labs ordered are listed, but only abnormal results are displayed) Labs Reviewed  BASIC METABOLIC PANEL - Abnormal; Notable for the following components:      Result Value   Glucose, Bld 110 (*)    Creatinine, Ser 1.03 (*)    All other components within normal limits  D-DIMER, QUANTITATIVE - Abnormal; Notable for the following components:   D-Dimer, Quant 0.79 (*)    All other components within normal limits  CBC  TROPONIN I (HIGH SENSITIVITY)  TROPONIN I (HIGH SENSITIVITY)    EKG None  Radiology CT Angio Chest PE W and/or Wo  Contrast Result Date: 12/25/2023 CLINICAL DATA:  Positive D-dimer, chest pressure. EXAM: CT ANGIOGRAPHY CHEST WITH CONTRAST TECHNIQUE: Multidetector CT imaging of the chest was performed using the standard protocol during bolus administration of intravenous contrast. Multiplanar CT image reconstructions and MIPs were obtained to evaluate the vascular anatomy. RADIATION DOSE REDUCTION: This exam was performed according to the departmental dose-optimization program which includes automated exposure control, adjustment of the mA and/or kV according to patient size and/or use of iterative reconstruction technique. CONTRAST:  80mL OMNIPAQUE IOHEXOL 350 MG/ML SOLN COMPARISON:  CT angiogram chest 12/30/2021 FINDINGS: Cardiovascular: Satisfactory opacification of the pulmonary arteries to the segmental level. No evidence of pulmonary embolism. Normal heart size. No pericardial effusion. Mediastinum/Nodes: No enlarged mediastinal, hilar, or axillary lymph nodes. Thyroid gland, trachea, and esophagus demonstrate no significant findings. Lungs/Pleura: There are minimal ground-glass opacities in the right upper lobe image 303/52. The lungs are otherwise clear. There is no pleural effusion or pneumothorax. Upper Abdomen: No acute abnormality. Musculoskeletal: No chest wall abnormality. No acute or significant osseous findings. Review of the MIP images confirms the above findings. IMPRESSION: 1. No evidence for pulmonary embolism. 2. Minimal ground-glass opacities in the right upper lobe, likely infectious/inflammatory. Electronically Signed   By: Darliss Cheney M.D.   On: 12/25/2023 18:04   DG Chest 2 View Result Date: 12/25/2023 CLINICAL DATA:  chest pain. EXAM: CHEST - 2 VIEW COMPARISON:  12/30/2021. FINDINGS: Bilateral lung fields are clear. Bilateral costophrenic angles are clear. Normal cardio-mediastinal silhouette. No acute osseous abnormalities. The soft tissues are within normal limits. IMPRESSION: No active  cardiopulmonary disease. Electronically Signed   By: Jules Schick M.D.   On: 12/25/2023 13:08    Procedures Procedures    Medications Ordered in ED Medications  iohexol (OMNIPAQUE) 350 MG/ML injection 100 mL (80 mLs Intravenous Contrast Given 12/25/23 1526)  alum & mag hydroxide-simeth (MAALOX/MYLANTA) 200-200-20 MG/5ML suspension 30 mL (30 mLs Oral Given 12/25/23 1815)    And  lidocaine (XYLOCAINE) 2 % viscous mouth solution 15 mL (15 mLs Oral Given 12/25/23 1815)  ketorolac (TORADOL) 15 MG/ML injection 15 mg (15 mg Intravenous Given 12/25/23 1816)    ED Course/ Medical Decision Making/ A&P                                 Medical Decision Making Amount and/or Complexity of Data Reviewed Labs: ordered. Radiology: ordered.  This patient presents to the ED with chief complaint(s) of chest pain with pertinent past medical history of NSTEMI, hypertension, PE which further complicates the presenting complaint. The complaint involves an extensive differential diagnosis and also carries with it a high risk of complications and morbidity.    The differential diagnosis includes NSTEMI, STEMI, atypical chest pain, PE, musculoskeletal pain  Additional history obtained: Records reviewed Care Everywhere/External Records  ED Course and Reassessment: Patient given GI cocktail and Toradol for pain.  Independent labs interpretation:  The following labs were independently interpreted:  CBC: No notable findings BMP: Mildly elevated creatinine 1.03 Troponin: 5, 4 D-dimer: 0.79 EKG: Sinus rhythm, possible LAE  Independent visualization of imaging: - I independently visualized the following imaging with scope of interpretation limited to determining acute life threatening conditions related to emergency care: Chest x-ray, which revealed no active cardiopulmonary disease  CTA PE: No evidence of PE.  Minimal groundglass opacities in the right upper lobe, likely  infectious/inflammatory  Consultation: - Consulted or discussed management/test interpretation w/ external professional: None  Consideration for admission or further workup: Considered for mission or further workup however patient's vital signs, physical exam, labs, and imaging were reassuring.  Patient symptoms likely due to musculoskeletal pain.  Patient advised to take Tylenol and Motrin as needed for pain and also prescribed Lidoderm patches.  Patient should follow-up with primary care in the upcoming days if her symptoms persist and for evaluation of groundglass opacities in the right upper lobe which are most likely inflammatory given that the patient  does not have any infectious-like symptoms.        Final Clinical Impression(s) / ED Diagnoses Final diagnoses:  Atypical chest pain    Rx / DC Orders ED Discharge Orders          Ordered    lidocaine (LIDODERM) 5 %  Every 24 hours        12/25/23 1811              Dolphus Jenny, PA-C 12/25/23 1818    Melene Plan, DO 12/26/23 [phone]

## 2023-12-25 NOTE — Discharge Instructions (Signed)
 Today you are seen for chest pain.  Please alternate taking Tylenol/Motrin as needed for pain.  You have also been prescribed Lidoderm patches as needed for pain.  You did have an incidental finding on your CT which should be followed up with your primary care physician in the upcoming days for further evaluation and possible treatment. Thank you for letting us treat you today. After reviewing your labs and imaging, I feel you are safe to go home. Please follow up with your PCP in the next several days and provide them with your records from this visit. Return to the Emergency Room if pain becomes severe or symptoms worsen.

## 2024-03-06 ENCOUNTER — Other Ambulatory Visit: Payer: Self-pay

## 2024-03-06 ENCOUNTER — Emergency Department (HOSPITAL_BASED_OUTPATIENT_CLINIC_OR_DEPARTMENT_OTHER)
Admission: EM | Admit: 2024-03-06 | Discharge: 2024-03-06 | Disposition: A | Discharge status: Home / Self Care | Attending: Emergency Medicine | Admitting: Emergency Medicine

## 2024-03-06 ENCOUNTER — Emergency Department (HOSPITAL_BASED_OUTPATIENT_CLINIC_OR_DEPARTMENT_OTHER)

## 2024-03-06 DIAGNOSIS — R079 Chest pain, unspecified: Secondary | ICD-10-CM | POA: Diagnosis present

## 2024-03-06 DIAGNOSIS — Z7901 Long term (current) use of anticoagulants: Secondary | ICD-10-CM | POA: Insufficient documentation

## 2024-03-06 DIAGNOSIS — E876 Hypokalemia: Secondary | ICD-10-CM | POA: Diagnosis not present

## 2024-03-06 DIAGNOSIS — Z7982 Long term (current) use of aspirin: Secondary | ICD-10-CM | POA: Insufficient documentation

## 2024-03-06 LAB — CBC
HCT: 39.5 % (ref 36.0–46.0)
Hemoglobin: 13.3 g/dL (ref 12.0–15.0)
MCH: 28.4 pg (ref 26.0–34.0)
MCHC: 33.7 g/dL (ref 30.0–36.0)
MCV: 84.4 fL (ref 80.0–100.0)
Platelets: 313 10*3/uL (ref 150–400)
RBC: 4.68 MIL/uL (ref 3.87–5.11)
RDW: 14.9 % (ref 11.5–15.5)
WBC: 9.4 10*3/uL (ref 4.0–10.5)
nRBC: 0 % (ref 0.0–0.2)

## 2024-03-06 LAB — BASIC METABOLIC PANEL WITH GFR
Anion gap: 11 (ref 5–15)
BUN: 17 mg/dL (ref 8–23)
CO2: 25 mmol/L (ref 22–32)
Calcium: 9.2 mg/dL (ref 8.9–10.3)
Chloride: 102 mmol/L (ref 98–111)
Creatinine, Ser: 1.11 mg/dL — ABNORMAL HIGH (ref 0.44–1.00)
GFR, Estimated: 56 mL/min — ABNORMAL LOW (ref >60)
Glucose, Bld: 119 mg/dL — ABNORMAL HIGH (ref 70–99)
Potassium: 3.4 mmol/L — ABNORMAL LOW (ref 3.5–5.1)
Sodium: 139 mmol/L (ref 135–145)

## 2024-03-06 LAB — RESP PANEL BY RT-PCR (RSV, FLU A&B, COVID)  RVPGX2
Influenza A by PCR: NEGATIVE
Influenza B by PCR: NEGATIVE
Resp Syncytial Virus by PCR: NEGATIVE
SARS Coronavirus 2 by RT PCR: NEGATIVE

## 2024-03-06 LAB — TROPONIN T, HIGH SENSITIVITY: Troponin T High Sensitivity: <15 ng/L (ref <19)

## 2024-03-06 MED ORDER — POTASSIUM CHLORIDE CRYS ER 20 MEQ PO TBCR
40.0000 meq | EXTENDED_RELEASE_TABLET | Freq: Once | ORAL | Status: AC
  Administered 2024-03-06: 40 meq via ORAL
  Filled 2024-03-06: qty 2

## 2024-03-06 NOTE — ED Triage Notes (Signed)
 Mid chest pain , tightness and sharp pain radiating to back , x 3 days . Unable to go to sleep due to pain , she said . Denies shortness of breath or cough . Hx HTN .

## 2024-03-06 NOTE — Discharge Instructions (Signed)
 Follow-up with your primary care physician in regards to your chest pain but also your low potassium.  If you develop recurrent, continued, or worsening chest pain, shortness of breath, fever, vomiting, abdominal or back pain, or any other new/concerning symptoms then return to the ER for evaluation.

## 2024-03-06 NOTE — ED Provider Notes (Signed)
 [facility] Provider Note   CSN: [id number] Arrival date & time: 03/06/24  4098     History  Chief Complaint  Patient presents with   Chest Pain    Victoria Petersen is a 63 y.o. female.  HPI 63 year old female presents with chest pain.  She was in a hospital ER waiting room 3 days ago for her husband.  She was in there for hours and there were a lot of sick people and she is worried she might of caught something.  The next day she developed chest pain.  It is sharp in the middle of her chest.  There is also little concomitant pain in her back though does not feel like the pain is going from her chest to her back.  She denies any cough, shortness of breath, sore throat.  Her stomach felt a little upset earlier like she was going to have diarrhea but right now has no abdominal pain.  No fevers noted.  No unilateral or bilateral leg swelling, recent travel (besides going to [facility]) or surgery.  No pleuritic symptoms, exertional symptoms and nothing makes the pain better or worse.  It is constant.  Patient notes she had a PE a few years ago but that was different and she had more shortness of breath at that time.  She is no longer on blood thinners but this does not feel like that did.  Home Medications Prior to Admission medications   Medication Sig Start Date End Date Taking? Authorizing Provider  acetaminophen  (TYLENOL ) 500 MG tablet Take 1,000 mg by mouth every 6 (six) hours as needed for moderate pain.    [provider]  aspirin  EC 81 MG EC tablet Take 1 tablet (81 mg total) by mouth daily. Swallow whole. 01/04/22   Pasqual Bone, MD  atorvastatin  (LIPITOR) 40 MG tablet Take 1 tablet (40 mg total) by mouth daily. 01/04/22   Pasqual Bone, MD  bupivacaine (MARCAINE) 0.25 % injection Inject 5 mLs into the articular space once. 12/20/21   [provider]  cephALEXin  (KEFLEX ) 500 MG capsule Take 1 capsule (500 mg total) by mouth  every 8 (eight) hours. 01/03/22   Pasqual Bone, MD  diltiazem  (CARDIZEM  CD) 360 MG 24 hr capsule Take 360 mg by mouth daily. 12/22/21   [provider]  hydrALAZINE  (APRESOLINE ) 25 MG tablet Take 12.5 mg by mouth 2 (two) times daily. 12/02/21   [provider]  hydrochlorothiazide  (HYDRODIURIL ) 12.5 MG tablet Take 12.5 mg by mouth daily. 12/23/21   [provider]  JANUVIA 100 MG tablet Take 100 mg by mouth daily. 12/02/21   [provider]  lidocaine  (LIDODERM ) 5 % Place 1 patch onto the skin daily. Remove & Discard patch within 12 hours or as directed by MD 12/25/23   Carie Charity, PA-C  magnesium gluconate (MAGONATE) 500 MG tablet Take 500 mg by mouth 2 (two) times daily.    [provider]  metoprolol  succinate (TOPROL -XL) 50 MG 24 hr tablet Take 1 tablet (50 mg total) by mouth daily. Take with or immediately following a meal. 01/04/22   Pasqual Bone, MD  Naproxen Sodium (ALEVE PO) Take 1 tablet by mouth daily as needed (pain).    [provider]  nitroGLYCERIN  (NITROSTAT ) 0.4 MG SL tablet Place 1 tablet (0.4 mg total) under the tongue every 5 (five) minutes x 3 doses as needed for chest pain. 01/03/22   Pasqual Bone, MD  potassium chloride  (  KLOR-CON  M) 10 MEQ tablet Take 1 tablet (10 mEq total) by mouth daily. 01/04/22   Pasqual Bone, MD  RIVAROXABAN  (XARELTO ) VTE STARTER PACK (15 & 20 MG) Take one 15 mg tablet by mouth 2 (two) times daily with a meal for 21 days, then take one 20mg  tab daily with supper. (Follow instructions on pack.) 01/03/22   Pasqual Bone, MD  rivaroxaban  (XARELTO ) 20 MG TABS tablet Take 1 tablet (20 mg total) by mouth daily with supper. (take after starter pack is completed) 01/23/22   Pasqual Bone, MD  traMADol  (ULTRAM ) 50 MG tablet Take 1 tablet (50 mg total) by mouth every 6 (six) hours as needed for moderate pain. 01/03/22   Pasqual Bone, MD      Allergies    Olmesartan, Amoxicillin-pot clavulanate, Lisinopril,  Metformin and related, Percocet [oxycodone-acetaminophen ], and Tape    Review of Systems   Review of Systems  Constitutional:  Negative for fever.  HENT:  Negative for sore throat.   Respiratory:  Negative for cough and shortness of breath.   Cardiovascular:  Positive for chest pain. Negative for leg swelling.  Gastrointestinal:  Negative for abdominal pain.    Physical Exam Updated Vital Signs BP 133/82   Pulse 77   Temp 98.7 F (37.1 C) (Oral)   Resp 17   Wt 114.3 kg   SpO2 95%   BMI 43.26 kg/m  Physical Exam Vitals and nursing note reviewed. Exam conducted with a chaperone present.  Constitutional:      Appearance: She is well-developed.  HENT:     Head: Normocephalic and atraumatic.  Cardiovascular:     Rate and Rhythm: Normal rate and regular rhythm.     Heart sounds: Normal heart sounds.  Pulmonary:     Effort: Pulmonary effort is normal.     Breath sounds: Normal breath sounds.  Chest:     Chest wall: No tenderness.  Abdominal:     General: There is no distension.     Palpations: Abdomen is soft.     Tenderness: There is no abdominal tenderness.  Musculoskeletal:     Right lower leg: No edema.     Left lower leg: No edema.     Comments: No thoracic back tenderness or rash.  Skin:    General: Skin is warm and dry.  Neurological:     Mental Status: She is alert.     ED Results / Procedures / Treatments   Labs (all labs ordered are listed, but only abnormal results are displayed) Labs Reviewed  BASIC METABOLIC PANEL WITH GFR - Abnormal; Notable for the following components:      Result Value   Potassium 3.4 (*)    Glucose, Bld 119 (*)    Creatinine, Ser 1.11 (*)    GFR, Estimated 56 (*)    All other components within normal limits  RESP PANEL BY RT-PCR (RSV, FLU A&B, COVID)  [id number]  CBC  TROPONIN T, HIGH SENSITIVITY    EKG EKG Interpretation Date/Time:  Thursday Mar 06 2024 08:38:30 EDT Ventricular Rate:  74 PR Interval:  179 QRS  Duration:  115 QT Interval:  430 QTC Calculation: 478 R Axis:   -57  Text Interpretation: Sinus rhythm Left anterior fascicular block Probable left ventricular hypertrophy Baseline wander in lead(s) V5 no significant change since Mar 2025 Confirmed by Jerilynn Montenegro [phone]) on 03/06/2024 8:44:51 AM  Radiology DG Chest 2 View Result Date: 03/06/2024 CLINICAL DATA:  Chest pain for 3 days. EXAM: CHEST -  2 VIEW COMPARISON:  December 25, 2023. FINDINGS: The heart size and mediastinal contours are within normal limits. Both lungs are clear. The visualized skeletal structures are unremarkable. IMPRESSION: No active cardiopulmonary disease. Electronically Signed   By: Rosalene Colon M.D.   On: 03/06/2024 10:11    Procedures Procedures    Medications Ordered in ED Medications  potassium chloride  SA (KLOR-CON  M) CR tablet 40 mEq (40 mEq Oral Given 03/06/24 1003)    ED Course/ Medical Decision Making/ A&P                                 Medical Decision Making Amount and/or Complexity of Data Reviewed Labs: ordered.    Details: Normal troponin.  Mild hypokalemia. Radiology: ordered and independent interpretation performed.    Details: No CHF or pneumonia ECG/medicine tests: ordered and independent interpretation performed.    Details: No ischemia  Risk Prescription drug management.   Patient presents with multiple days of continued nonexertional and nonspecific chest pain.  No pleuritic symptoms.  She has had a history of a prior PE but states this feels different, has no pleuritic pain, hypoxia, shortness of breath, signs or symptoms of DVT.  I think this is unlikely and she recently had a similar workup which resulted in a negative CTA couple months ago.  Discussed all this with her and she agrees.  Troponin is negative, and given she has had continuous symptoms I do not think a repeat is needed.  ECG is reassuring.  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  My suspicion for ACS and dissection is quite low.  At this point, will discharge home with return precautions I discussed she needs to follow-up with PCP.         Final Clinical Impression(s) / ED Diagnoses Final diagnoses:  Nonspecific chest pain  Hypokalemia    Rx / DC Orders ED Discharge Orders     None         Jerilynn Montenegro, MD 03/06/24 1036
